# Patient Record
Sex: Female | Born: 1960
Health system: Southern US, Community
[De-identification: ages and names within clinical notes are randomized; demographics above are authoritative.]

## PROBLEM LIST (undated history)

## (undated) DIAGNOSIS — J45909 Unspecified asthma, uncomplicated: Secondary | ICD-10-CM

## (undated) HISTORY — PX: CHOLECYSTECTOMY: SHX55

## (undated) HISTORY — DX: Unspecified asthma, uncomplicated: J45.909

## (undated) HISTORY — PX: ABDOMINAL HYSTERECTOMY: SHX81

---

## 1998-06-22 ENCOUNTER — Ambulatory Visit (HOSPITAL_COMMUNITY): Admission: RE | Admit: 1998-06-22 | Discharge: 1998-06-22 | Payer: Self-pay | Admitting: Otolaryngology

## 2006-02-21 ENCOUNTER — Emergency Department (HOSPITAL_COMMUNITY): Admission: EM | Admit: 2006-02-21 | Discharge: 2006-02-21 | Payer: Self-pay | Admitting: Emergency Medicine

## 2011-10-24 ENCOUNTER — Emergency Department (HOSPITAL_COMMUNITY): Payer: Self-pay

## 2011-10-24 ENCOUNTER — Encounter: Payer: Self-pay | Admitting: Emergency Medicine

## 2011-10-24 ENCOUNTER — Emergency Department (HOSPITAL_COMMUNITY)
Admission: EM | Admit: 2011-10-24 | Discharge: 2011-10-24 | Disposition: A | Discharge status: Home / Self Care | Payer: Self-pay | Attending: Emergency Medicine | Admitting: Emergency Medicine

## 2011-10-24 DIAGNOSIS — W19XXXA Unspecified fall, initial encounter: Secondary | ICD-10-CM

## 2011-10-24 DIAGNOSIS — W010XXA Fall on same level from slipping, tripping and stumbling without subsequent striking against object, initial encounter: Secondary | ICD-10-CM | POA: Insufficient documentation

## 2011-10-24 DIAGNOSIS — M25539 Pain in unspecified wrist: Secondary | ICD-10-CM | POA: Insufficient documentation

## 2011-10-24 DIAGNOSIS — M545 Low back pain, unspecified: Secondary | ICD-10-CM | POA: Insufficient documentation

## 2011-10-24 DIAGNOSIS — Y9229 Other specified public building as the place of occurrence of the external cause: Secondary | ICD-10-CM | POA: Insufficient documentation

## 2011-10-24 DIAGNOSIS — M25579 Pain in unspecified ankle and joints of unspecified foot: Secondary | ICD-10-CM | POA: Insufficient documentation

## 2011-10-24 DIAGNOSIS — S060X9A Concussion with loss of consciousness of unspecified duration, initial encounter: Secondary | ICD-10-CM | POA: Insufficient documentation

## 2011-10-24 DIAGNOSIS — R51 Headache: Secondary | ICD-10-CM | POA: Insufficient documentation

## 2011-10-24 DIAGNOSIS — M549 Dorsalgia, unspecified: Secondary | ICD-10-CM | POA: Insufficient documentation

## 2011-10-24 DIAGNOSIS — S0990XA Unspecified injury of head, initial encounter: Secondary | ICD-10-CM

## 2011-10-24 DIAGNOSIS — S63509A Unspecified sprain of unspecified wrist, initial encounter: Secondary | ICD-10-CM | POA: Insufficient documentation

## 2011-10-24 DIAGNOSIS — M542 Cervicalgia: Secondary | ICD-10-CM | POA: Insufficient documentation

## 2011-10-24 DIAGNOSIS — R404 Transient alteration of awareness: Secondary | ICD-10-CM | POA: Insufficient documentation

## 2011-10-24 DIAGNOSIS — M25529 Pain in unspecified elbow: Secondary | ICD-10-CM | POA: Insufficient documentation

## 2011-10-24 DIAGNOSIS — S93409A Sprain of unspecified ligament of unspecified ankle, initial encounter: Secondary | ICD-10-CM | POA: Insufficient documentation

## 2011-10-24 MED ORDER — ONDANSETRON 8 MG PO TBDP
8.0000 mg | ORAL_TABLET | Freq: Once | ORAL | Status: AC
  Administered 2011-10-24: 8 mg via ORAL
  Filled 2011-10-24: qty 1

## 2011-10-24 MED ORDER — PROMETHAZINE HCL 25 MG PO TABS: 25.0000 mg | ORAL_TABLET | Freq: Four times a day (QID) | ORAL | Status: AC | PRN

## 2011-10-24 MED ORDER — HYDROCODONE-ACETAMINOPHEN 5-325 MG PO TABS: 1.0000 | ORAL_TABLET | ORAL | Status: AC | PRN

## 2011-10-24 MED ORDER — HYDROCODONE-ACETAMINOPHEN 5-325 MG PO TABS
1.0000 | ORAL_TABLET | Freq: Once | ORAL | Status: AC
  Administered 2011-10-24: 1 via ORAL
  Filled 2011-10-24: qty 1

## 2011-10-24 MED ORDER — IBUPROFEN 800 MG PO TABS
800.0000 mg | ORAL_TABLET | Freq: Once | ORAL | Status: AC
  Administered 2011-10-24: 800 mg via ORAL
  Filled 2011-10-24: qty 1

## 2011-10-24 MED ORDER — IBUPROFEN 400 MG PO TABS: 400.0000 mg | ORAL_TABLET | Freq: Three times a day (TID) | ORAL | Status: AC | PRN

## 2011-10-24 MED ORDER — OXYCODONE-ACETAMINOPHEN 5-325 MG PO TABS
1.0000 | ORAL_TABLET | Freq: Once | ORAL | Status: AC
  Administered 2011-10-24: 1 via ORAL
  Filled 2011-10-24: qty 1

## 2011-10-24 NOTE — ED Notes (Signed)
Pt states fell inside store. Pt states "came to" and needed help to get up, pt states L side pain from head to toe, and swelling in L hand.

## 2011-10-24 NOTE — ED Provider Notes (Signed)
History     CSN: [id number] Arrival date & time: 10/24/2011 12:42 PM   First MD Initiated Contact with Patient 10/24/11 1520      Chief Complaint  Patient presents with  . Fall     Patient is a 50 y.o. female presenting with fall. The history is provided by the patient.  Fall The accident occurred 1 to 2 hours ago. The fall occurred while walking. She fell from a height of 3 to 5 ft. She landed on a hard floor. The pain is present in the head, left elbow and left wrist. The pain is at a severity of 9/10. The pain is severe. She was ambulatory at the scene. There was no entrapment after the fall. There was no alcohol use involved in the accident. Associated symptoms include loss of consciousness. Pertinent negatives include no visual change, no numbness and no bowel incontinence. The symptoms are aggravated by activity.  Reports she was walking into [employer] when she slipped and both feet went out from under her and she fell. Unsure what hit first but now w/ c/o pain to head, neck, T-spine, L-spine, (L) wrist, (L) elbow and (L) ankle and states she is starting to get sore all over  History reviewed. No pertinent past medical history.  Past Surgical History  Procedure Date  . Cholecystectomy   . Abdominal hysterectomy     History reviewed. No pertinent family history.  History  Substance Use Topics  . Smoking status: Never Smoker   . Smokeless tobacco: Not on file  . Alcohol Use: No    OB History    Grav Para Term Preterm Abortions TAB SAB Ect Mult Living                  Review of Systems  Constitutional: Negative.   HENT: Negative.   Eyes: Negative.   Respiratory: Negative.   Cardiovascular: Negative.   Gastrointestinal: Negative.  Negative for bowel incontinence.  Genitourinary: Negative.   Musculoskeletal: Negative.   Skin: Negative.   Neurological: Positive for loss of consciousness. Negative for numbness.  Hematological: Negative.   Psychiatric/Behavioral:  Negative.     Allergies  Review of patient's allergies indicates no known allergies.  Home Medications   Current Outpatient Rx  Name Route Sig Dispense Refill  . MULTI-VITAMIN/MINERALS PO TABS Oral Take 1 tablet by mouth daily.        BP 131/61  Pulse 92  Temp(Src) 99.8 F (37.7 C) (Oral)  Resp 16  SpO2 97%  Physical Exam  Constitutional: She is oriented to person, place, and time. She appears well-developed and well-nourished.  HENT:  Head: Normocephalic and atraumatic.    Eyes: Conjunctivae are normal.  Neck: Neck supple.  Cardiovascular: Normal rate and regular rhythm.   Pulmonary/Chest: Effort normal and breath sounds normal.  Abdominal: Soft. Bowel sounds are normal.  Musculoskeletal: Normal range of motion.       Back:       Arms:      Feet:  Neurological: She is alert and oriented to person, place, and time.  Skin: Skin is warm and dry. Rash noted. Rash is papular. No erythema.  Psychiatric: She has a normal mood and affect.    ED Course  Procedures  Findings and impression discussed w/ pt. Cam walker placed on (L) foot and velcro splint to (L) wrist. Pt fitted and instructed for crutches. Will plan for d/c home w/ medication for pain and ortho follow up instructions.   Labs Reviewed -  No data to display Dg Thoracic Spine 2 View  10/24/2011  *RADIOLOGY REPORT*  Clinical Data: Fall, back pain  THORACIC SPINE - 2 VIEW  Comparison: None.  Findings: Normal alignment of the thoracic vertebral bodies.  No loss vertebral body height or disc height.  Normal paraspinal lines.  IMPRESSION: No evidence of thoracic spine fracture.  Original Report Authenticated By: Genevive Bi, M.D.   Dg Lumbar Spine Complete  10/24/2011  *RADIOLOGY REPORT*  Clinical Data:  Fall, back  LUMBAR SPINE - COMPLETE 4+ VIEW  Comparison: Pain  Findings: Normal alignment of the lumbar vertebral body.  No loss of vertebral body height or disc height.  No subluxation.  No pars fracture.   IMPRESSION: No evidence of lumbar spine injury.  Original Report Authenticated By: Genevive Bi, M.D.   Dg Elbow Complete Left  10/24/2011  *RADIOLOGY REPORT*  Clinical Data: Larey Seat, pain.  LEFT ELBOW - COMPLETE 3+ VIEW  Comparison:  None.  Findings:  There is no evidence of fracture, dislocation, or joint effusion.  There is no evidence of arthropathy or other focal bone abnormality.  Soft tissues are unremarkable.  IMPRESSION: Negative.  Original Report Authenticated By: Elsie Stain, M.D.   Dg Wrist Complete Left  10/24/2011  *RADIOLOGY REPORT*  Clinical Data: Fall, pain  LEFT WRIST - COMPLETE 3+ VIEW  Comparison:  None.  Findings:  There is no evidence of fracture or dislocation.  There is no evidence of arthropathy or other focal bone abnormality. Soft tissues are unremarkable.  IMPRESSION: Negative.  Original Report Authenticated By: Elsie Stain, M.D.   Dg Ankle Complete Left  10/24/2011  *RADIOLOGY REPORT*  Clinical Data: Larey Seat, pain  LEFT ANKLE COMPLETE - 3+ VIEW  Comparison: 02/21/2006  Findings: Lateral soft tissue swelling.  Question small avulsion lateral malleolus (arrow).  Healed fracture lateral fifth metatarsal.  No distal fibular or medial malleolar fracture.  Ankle mortise intact.  IMPRESSION: Lateral soft tissue swelling.  Cannot exclude small avulsion distal aspect of the lateral malleolus.  Original Report Authenticated By: Elsie Stain, M.D.   Ct Head Wo Contrast  10/24/2011  *RADIOLOGY REPORT*  Clinical Data:  Larey Seat, head pain.  Neck pain.  CT HEAD WITHOUT CONTRAST CT CERVICAL SPINE WITHOUT CONTRAST  Technique:  Multidetector CT imaging of the head and cervical spine was performed following the standard protocol without intravenous contrast.  Multiplanar CT image reconstructions of the cervical spine were also generated.  Comparison:  None.  CT HEAD  Findings: There is no evidence for acute infarction, intracranial hemorrhage, mass lesion, hydrocephalus, or extra-axial  fluid. There is no atrophy or white matter disease. 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  The calvarium is intact.  There is no acute sinus or mastoid fluid.  IMPRESSION: Chronic changes as described.  No evidence for acute intracranial hemorrhage or skull fracture.  CT CERVICAL SPINE  Findings: There is no visible cervical spine fracture or traumatic subluxation. There is no prevertebral soft tissue swelling or intraspinal abnormality.  There is disc space narrowing most pronounced at C5-C6 with osteophytic spurring, worse on the left. 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 Lung apices appear clear without pneumothorax.  Upper thoracic ribs unremarkable.  Trachea midline.  No visible neck masses.  IMPRESSION: Cervical spondylosis.  No acute abnormality.  Original Report Authenticated  By: Elsie Stain, M.D.   Ct Cervical Spine Wo Contrast  10/24/2011  *RADIOLOGY REPORT*  Clinical Data:  Larey Seat, head pain.  Neck pain.  CT HEAD WITHOUT CONTRAST CT CERVICAL SPINE WITHOUT CONTRAST  Technique:  Multidetector CT imaging of the head and cervical spine was performed following the standard protocol without intravenous contrast.  Multiplanar CT image reconstructions of the cervical spine were also generated.  Comparison:  None.  CT HEAD  Findings: There is no evidence for acute infarction, intracranial hemorrhage, mass lesion, hydrocephalus, or extra-axial fluid. There is no atrophy or white matter disease. 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  The calvarium is intact.  There is no acute sinus or mastoid fluid.  IMPRESSION: Chronic changes as described.  No evidence for acute intracranial hemorrhage or skull fracture.  CT CERVICAL  SPINE  Findings: There is no visible cervical spine fracture or traumatic subluxation. There is no prevertebral soft tissue swelling or intraspinal abnormality.  There is disc space narrowing most pronounced at C5-C6 with osteophytic spurring, worse on the left. 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 Lung apices appear clear without pneumothorax.  Upper thoracic ribs unremarkable.  Trachea midline.  No visible neck masses.  IMPRESSION: Cervical spondylosis.  No acute abnormality.  Original Report Authenticated By: Elsie Stain, M.D.   Dg Hand Complete Left  10/24/2011  *RADIOLOGY REPORT*  Clinical Data: Larey Seat, hand pain  LEFT HAND - COMPLETE 3+ VIEW  Comparison:  None.  Findings:  There is no evidence of fracture or dislocation.  There is no evidence of arthropathy or other focal bone abnormality. Soft tissues are unremarkable.  IMPRESSION: Negative.  Original Report Authenticated By: Elsie Stain, M.D.     1. Fall   2. Wrist sprain   3. Ankle sprain   4. Minor head injury   5. Concussion       MDM  Multiple areas of pain s/p fall. Imaging unable to r/o small avulsion fracture to distal lateral malleolus.  Medical screening examination/treatment/procedure(s) were performed by non-physician practitioner and as supervising physician I was immediately available for consultation/collaboration. Osvaldo Human, M.D.       Leanne Chang, NP 10/24/11 1900  Carleene Cooper III, MD 10/25/11 1149

## 2011-12-06 ENCOUNTER — Ambulatory Visit: Payer: Self-pay | Attending: Orthopedic Surgery | Admitting: Physical Therapy

## 2011-12-06 DIAGNOSIS — IMO0001 Reserved for inherently not codable concepts without codable children: Secondary | ICD-10-CM | POA: Insufficient documentation

## 2011-12-06 DIAGNOSIS — M25579 Pain in unspecified ankle and joints of unspecified foot: Secondary | ICD-10-CM | POA: Insufficient documentation

## 2011-12-06 DIAGNOSIS — M25673 Stiffness of unspecified ankle, not elsewhere classified: Secondary | ICD-10-CM | POA: Insufficient documentation

## 2011-12-06 DIAGNOSIS — M25676 Stiffness of unspecified foot, not elsewhere classified: Secondary | ICD-10-CM | POA: Insufficient documentation

## 2011-12-07 ENCOUNTER — Ambulatory Visit: Payer: Self-pay | Admitting: Physical Therapy

## 2011-12-09 ENCOUNTER — Ambulatory Visit: Payer: Self-pay | Attending: Orthopedic Surgery | Admitting: Physical Therapy

## 2011-12-09 DIAGNOSIS — M25673 Stiffness of unspecified ankle, not elsewhere classified: Secondary | ICD-10-CM | POA: Insufficient documentation

## 2011-12-09 DIAGNOSIS — M25676 Stiffness of unspecified foot, not elsewhere classified: Secondary | ICD-10-CM | POA: Insufficient documentation

## 2011-12-09 DIAGNOSIS — IMO0001 Reserved for inherently not codable concepts without codable children: Secondary | ICD-10-CM | POA: Insufficient documentation

## 2011-12-09 DIAGNOSIS — M25579 Pain in unspecified ankle and joints of unspecified foot: Secondary | ICD-10-CM | POA: Insufficient documentation

## 2011-12-12 ENCOUNTER — Ambulatory Visit: Payer: Self-pay | Admitting: Physical Therapy

## 2011-12-14 ENCOUNTER — Ambulatory Visit: Payer: Self-pay | Admitting: Physical Therapy

## 2011-12-16 ENCOUNTER — Ambulatory Visit: Payer: Self-pay | Admitting: Physical Therapy

## 2011-12-19 ENCOUNTER — Ambulatory Visit: Payer: Self-pay | Admitting: Physical Therapy

## 2011-12-21 ENCOUNTER — Ambulatory Visit: Payer: Self-pay | Admitting: Physical Therapy

## 2011-12-23 ENCOUNTER — Ambulatory Visit: Payer: Self-pay | Admitting: Physical Therapy

## 2011-12-26 ENCOUNTER — Ambulatory Visit: Payer: Self-pay | Admitting: Physical Therapy

## 2011-12-28 ENCOUNTER — Ambulatory Visit: Payer: Self-pay | Admitting: Physical Therapy

## 2011-12-30 ENCOUNTER — Ambulatory Visit: Payer: Self-pay | Admitting: Physical Therapy

## 2012-01-02 ENCOUNTER — Ambulatory Visit: Payer: Self-pay | Admitting: Physical Therapy

## 2012-01-04 ENCOUNTER — Ambulatory Visit: Payer: Self-pay | Admitting: Physical Therapy

## 2012-01-06 ENCOUNTER — Ambulatory Visit: Payer: Self-pay | Attending: Orthopedic Surgery | Admitting: Physical Therapy

## 2012-01-06 DIAGNOSIS — IMO0001 Reserved for inherently not codable concepts without codable children: Secondary | ICD-10-CM | POA: Insufficient documentation

## 2012-01-06 DIAGNOSIS — M25579 Pain in unspecified ankle and joints of unspecified foot: Secondary | ICD-10-CM | POA: Insufficient documentation

## 2012-01-06 DIAGNOSIS — M25673 Stiffness of unspecified ankle, not elsewhere classified: Secondary | ICD-10-CM | POA: Insufficient documentation

## 2012-01-06 DIAGNOSIS — M25676 Stiffness of unspecified foot, not elsewhere classified: Secondary | ICD-10-CM | POA: Insufficient documentation

## 2012-01-09 ENCOUNTER — Ambulatory Visit: Payer: Self-pay | Admitting: Physical Therapy

## 2012-01-11 ENCOUNTER — Ambulatory Visit: Payer: Self-pay | Admitting: Physical Therapy

## 2012-01-13 ENCOUNTER — Ambulatory Visit: Payer: Self-pay | Admitting: Physical Therapy

## 2012-01-16 ENCOUNTER — Ambulatory Visit: Payer: Self-pay | Admitting: Physical Therapy

## 2012-01-18 ENCOUNTER — Ambulatory Visit: Payer: Self-pay | Admitting: Physical Therapy

## 2012-01-20 ENCOUNTER — Ambulatory Visit: Payer: Self-pay | Admitting: Physical Therapy

## 2012-01-23 ENCOUNTER — Ambulatory Visit: Payer: Self-pay | Admitting: Physical Therapy

## 2012-01-25 ENCOUNTER — Ambulatory Visit: Payer: Self-pay | Admitting: Physical Therapy

## 2012-01-27 ENCOUNTER — Ambulatory Visit: Payer: Self-pay | Admitting: Physical Therapy

## 2012-01-30 ENCOUNTER — Ambulatory Visit: Payer: Self-pay | Admitting: Physical Therapy

## 2012-02-01 ENCOUNTER — Ambulatory Visit: Payer: Self-pay | Admitting: Physical Therapy

## 2012-02-03 ENCOUNTER — Ambulatory Visit: Payer: Self-pay | Admitting: Physical Therapy

## 2012-02-06 ENCOUNTER — Ambulatory Visit: Payer: Self-pay | Attending: Orthopedic Surgery | Admitting: Physical Therapy

## 2012-02-06 DIAGNOSIS — M25676 Stiffness of unspecified foot, not elsewhere classified: Secondary | ICD-10-CM | POA: Insufficient documentation

## 2012-02-06 DIAGNOSIS — M25673 Stiffness of unspecified ankle, not elsewhere classified: Secondary | ICD-10-CM | POA: Insufficient documentation

## 2012-02-06 DIAGNOSIS — M25579 Pain in unspecified ankle and joints of unspecified foot: Secondary | ICD-10-CM | POA: Insufficient documentation

## 2012-02-06 DIAGNOSIS — IMO0001 Reserved for inherently not codable concepts without codable children: Secondary | ICD-10-CM | POA: Insufficient documentation

## 2012-02-08 ENCOUNTER — Ambulatory Visit: Payer: Self-pay | Admitting: Physical Therapy

## 2012-02-10 ENCOUNTER — Ambulatory Visit: Payer: Self-pay | Admitting: Physical Therapy

## 2012-02-13 ENCOUNTER — Ambulatory Visit: Payer: Self-pay | Admitting: Physical Therapy

## 2012-02-15 ENCOUNTER — Ambulatory Visit: Payer: Self-pay | Admitting: Physical Therapy

## 2012-02-17 ENCOUNTER — Ambulatory Visit: Payer: Self-pay | Admitting: Physical Therapy

## 2012-02-20 ENCOUNTER — Ambulatory Visit: Payer: Self-pay | Admitting: Physical Therapy

## 2012-02-22 ENCOUNTER — Ambulatory Visit: Payer: Self-pay | Admitting: Physical Therapy

## 2012-02-24 ENCOUNTER — Ambulatory Visit: Payer: Self-pay | Admitting: Physical Therapy

## 2012-02-27 ENCOUNTER — Ambulatory Visit: Payer: Self-pay | Admitting: Physical Therapy

## 2012-02-29 ENCOUNTER — Ambulatory Visit: Payer: Self-pay | Admitting: Physical Therapy

## 2012-03-02 ENCOUNTER — Ambulatory Visit: Payer: Self-pay | Admitting: Physical Therapy

## 2012-03-05 ENCOUNTER — Ambulatory Visit: Payer: Self-pay | Admitting: Physical Therapy

## 2012-03-09 ENCOUNTER — Ambulatory Visit: Payer: Self-pay | Attending: Orthopedic Surgery | Admitting: Physical Therapy

## 2012-03-09 DIAGNOSIS — M25673 Stiffness of unspecified ankle, not elsewhere classified: Secondary | ICD-10-CM | POA: Insufficient documentation

## 2012-03-09 DIAGNOSIS — IMO0001 Reserved for inherently not codable concepts without codable children: Secondary | ICD-10-CM | POA: Insufficient documentation

## 2012-03-09 DIAGNOSIS — M25676 Stiffness of unspecified foot, not elsewhere classified: Secondary | ICD-10-CM | POA: Insufficient documentation

## 2012-03-09 DIAGNOSIS — M25579 Pain in unspecified ankle and joints of unspecified foot: Secondary | ICD-10-CM | POA: Insufficient documentation

## 2012-03-12 ENCOUNTER — Ambulatory Visit: Payer: Self-pay | Admitting: Physical Therapy

## 2012-03-14 ENCOUNTER — Ambulatory Visit: Payer: Self-pay | Admitting: Physical Therapy

## 2012-03-16 ENCOUNTER — Ambulatory Visit: Payer: Self-pay | Admitting: Physical Therapy

## 2014-12-02 ENCOUNTER — Ambulatory Visit: Payer: Self-pay | Attending: Internal Medicine | Admitting: Internal Medicine

## 2014-12-02 ENCOUNTER — Encounter: Payer: Self-pay | Admitting: Internal Medicine

## 2014-12-02 VITALS — BP 131/84 | HR 80 | Temp 98.0°F | Resp 15 | Wt 286.0 lb

## 2014-12-02 DIAGNOSIS — R1310 Dysphagia, unspecified: Secondary | ICD-10-CM | POA: Insufficient documentation

## 2014-12-02 DIAGNOSIS — Z1231 Encounter for screening mammogram for malignant neoplasm of breast: Secondary | ICD-10-CM

## 2014-12-02 DIAGNOSIS — H6121 Impacted cerumen, right ear: Secondary | ICD-10-CM | POA: Insufficient documentation

## 2014-12-02 DIAGNOSIS — Z1211 Encounter for screening for malignant neoplasm of colon: Secondary | ICD-10-CM | POA: Insufficient documentation

## 2014-12-02 DIAGNOSIS — Z139 Encounter for screening, unspecified: Secondary | ICD-10-CM

## 2014-12-02 DIAGNOSIS — Z90710 Acquired absence of both cervix and uterus: Secondary | ICD-10-CM | POA: Insufficient documentation

## 2014-12-02 LAB — CBC WITH DIFFERENTIAL/PLATELET
BASOS ABS: 0.1 10*3/uL (ref 0.0–0.1)
BASOS PCT: 1 % (ref 0–1)
Eosinophils Absolute: 0.3 10*3/uL (ref 0.0–0.7)
Eosinophils Relative: 3 % (ref 0–5)
HCT: 47.3 % — ABNORMAL HIGH (ref 36.0–46.0)
HEMOGLOBIN: 15.3 g/dL — AB (ref 12.0–15.0)
LYMPHS ABS: 2.9 10*3/uL (ref 0.7–4.0)
Lymphocytes Relative: 29 % (ref 12–46)
MCH: 29 pg (ref 26.0–34.0)
MCHC: 32.3 g/dL (ref 30.0–36.0)
MCV: 89.6 fL (ref 78.0–100.0)
MONOS PCT: 9 % (ref 3–12)
MPV: 12.1 fL (ref 8.6–12.4)
Monocytes Absolute: 0.9 10*3/uL (ref 0.1–1.0)
NEUTROS ABS: 5.7 10*3/uL (ref 1.7–7.7)
NEUTROS PCT: 58 % (ref 43–77)
Platelets: 248 10*3/uL (ref 150–400)
RBC: 5.28 MIL/uL — AB (ref 3.87–5.11)
RDW: 13.6 % (ref 11.5–15.5)
WBC: 9.9 10*3/uL (ref 4.0–10.5)

## 2014-12-02 LAB — HEMOGLOBIN A1C
HEMOGLOBIN A1C: 5.9 % — AB (ref <5.7)
Mean Plasma Glucose: 123 mg/dL — ABNORMAL HIGH (ref <117)

## 2014-12-02 LAB — COMPLETE METABOLIC PANEL WITH GFR
ALT: 30 U/L (ref 0–35)
AST: 27 U/L (ref 0–37)
Albumin: 4.4 g/dL (ref 3.5–5.2)
Alkaline Phosphatase: 55 U/L (ref 39–117)
BUN: 10 mg/dL (ref 6–23)
CALCIUM: 10.2 mg/dL (ref 8.4–10.5)
CHLORIDE: 102 meq/L (ref 96–112)
CO2: 29 mEq/L (ref 19–32)
CREATININE: 0.52 mg/dL (ref 0.50–1.10)
GFR, Est African American: >89 mL/min
GFR, Est Non African American: >89 mL/min
Glucose, Bld: 88 mg/dL (ref 70–99)
Potassium: 4.5 mEq/L (ref 3.5–5.3)
Sodium: 142 mEq/L (ref 135–145)
TOTAL PROTEIN: 7.5 g/dL (ref 6.0–8.3)
Total Bilirubin: 0.6 mg/dL (ref 0.2–1.2)

## 2014-12-02 LAB — TSH: TSH: 3.113 u[IU]/mL (ref 0.350–4.500)

## 2014-12-02 MED ORDER — CARBAMIDE PEROXIDE 6.5 % OT SOLN: 5.0000 [drp] | Freq: Two times a day (BID) | OTIC | Status: DC

## 2014-12-02 NOTE — Progress Notes (Signed)
Patient Demographics  Jasmine Martin, is a 54 y.o. female  UDJ:[id number]  HYI:[id number]  DOB - 14-Aug-1961  CC:  Chief Complaint  Patient presents with  . Establish Care       HPI: Jasmine Martin is a 54 y.o. female here today to establish medical care.patient reported to have difficulty swallowing for the last 2 years, the patient reported that problem is usually with a solid food and gradually progressed, patient denies much of reflux symptoms, denies any change in bowel habits, denies any change in weight or appetite, denies any abdominal pain nausea vomiting. Patient has No headache, No chest pain, No abdominal pain - No Nausea, No new weakness tingling or numbness, No Cough - SOB.  No Known Allergies History reviewed. No pertinent past medical history. Current Outpatient Prescriptions on File Prior to Visit  Medication Sig Dispense Refill  . Multiple Vitamins-Minerals (MULTIVITAMIN WITH MINERALS) tablet Take 1 tablet by mouth daily.       No current facility-administered medications on file prior to visit.   Family History  Problem Relation Age of Onset  . Cancer Father     liver cancer   . Hypertension Maternal Aunt    History   Social History  . Marital Status: Single    Spouse Name: N/A    Number of Children: N/A  . Years of Education: N/A   Occupational History  . Not on file.   Social History Main Topics  . Smoking status: Never Smoker   . Smokeless tobacco: Not on file  . Alcohol Use: No  . Drug Use: No  . Sexual Activity: Not on file   Other Topics Concern  . Not on file   Social History Narrative    Review of Systems: Constitutional: Negative for fever, chills, diaphoresis, activity change, appetite change and fatigue. HENT: Negative for ear pain, nosebleeds, congestion, facial swelling, rhinorrhea, neck pain, neck stiffness and ear discharge.  Eyes: Negative for pain, discharge, redness, itching and visual disturbance. Respiratory:  Negative for cough, choking, chest tightness, shortness of breath, wheezing and stridor.  Cardiovascular: Negative for chest pain, palpitations and leg swelling. Gastrointestinal: Negative for abdominal distention. Genitourinary: Negative for dysuria, urgency, frequency, hematuria, flank pain, decreased urine volume, difficulty urinating and dyspareunia.  Musculoskeletal: Negative for back pain, joint swelling, arthralgia and gait problem. Neurological: Negative for dizziness, tremors, seizures, syncope, facial asymmetry, speech difficulty, weakness, light-headedness, numbness and headaches.  Hematological: Negative for adenopathy. Does not bruise/bleed easily. Psychiatric/Behavioral: Negative for hallucinations, behavioral problems, confusion, dysphoric mood, decreased concentration and agitation.    Objective:   Filed Vitals:   12/02/14 1508  BP: 131/84  Pulse: 80  Temp: 98 F (36.7 C)  Resp: 15    Physical Exam: Constitutional: obese female not in acute distress  HENT: Normocephalic, atraumatic, External right and left ear normal. Oropharynx is clear and moist. Increased wax in right ear  Eyes: Conjunctivae and EOM are normal. PERRLA, no scleral icterus. Neck: Normal ROM. Neck supple. No JVD. No tracheal deviation. No thyromegaly. CVS: RRR, S1/S2 +, no murmurs, no gallops, no carotid bruit.  Pulmonary: Effort and breath sounds normal, no stridor, rhonchi, wheezes, rales.  Abdominal: Soft. BS +, no distension, tenderness, rebound or guarding.  Musculoskeletal: Normal range of motion. No edema and no tenderness.  Neuro: Alert. Normal reflexes, muscle tone coordination. No cranial nerve deficit. Skin: Skin is warm and dry. No rash noted. Not diaphoretic. No erythema. No pallor. Psychiatric: Normal mood and affect. Behavior, judgment,  thought content normal.  No results found for: WBC, HGB, HCT, MCV, PLT No results found for: CREATININE, BUN, NA, K, CL, CO2  No results found for:  HGBA1C Lipid Panel  No results found for: CHOL, TRIG, HDL, CHOLHDL, VLDL, LDLCALC     Assessment and plan:   1. Swallowing difficulty  - Ambulatory referral to Gastroenterology  2. Special screening for malignant neoplasms, colon  - Ambulatory referral to Gastroenterology  3. Encounter for screening mammogram for breast cancer  - MM DIGITAL SCREENING BILATERAL; Future  4. Screening Her ordered baseline blood work  - CBC with Differential/Platelet - COMPLETE METABOLIC PANEL WITH GFR - TSH - Vit D  25 hydroxy (rtn osteoporosis monitoring) - Hemoglobin A1c  5. Excess ear wax, right  - carbamide peroxide (DEBROX) 6.5 % otic solution; Place 5 drops into both ears 2 (two) times daily.  Dispense: 15 mL; Refill: 1      Health Maintenance -Colonoscopy: referred to GI  -Pap Smear: patient is s/p hystrectomy  -Mammogram: ordered  -Vaccinations:  uptodate with the flu shot   Return in about 3 months (around 03/03/2015), or if symptoms worsen or fail to improve.  Jasmine Marek, MD

## 2014-12-02 NOTE — Progress Notes (Signed)
Patient here to establish care Complains of having trouble swallowing  This issue has been going on for about two years Purees her food to eat  Has not been to a specialist to have this addressed

## 2014-12-03 LAB — VITAMIN D 25 HYDROXY (VIT D DEFICIENCY, FRACTURES): VIT D 25 HYDROXY: 19 ng/mL — AB (ref 30–100)

## 2014-12-10 ENCOUNTER — Encounter: Payer: Self-pay | Admitting: Gastroenterology

## 2014-12-10 ENCOUNTER — Ambulatory Visit (INDEPENDENT_AMBULATORY_CARE_PROVIDER_SITE_OTHER): Payer: Self-pay | Admitting: Gastroenterology

## 2014-12-10 VITALS — BP 122/74 | HR 76 | Ht 63.5 in | Wt 284.2 lb

## 2014-12-10 DIAGNOSIS — Z1211 Encounter for screening for malignant neoplasm of colon: Secondary | ICD-10-CM

## 2014-12-10 DIAGNOSIS — R1310 Dysphagia, unspecified: Secondary | ICD-10-CM

## 2014-12-10 MED ORDER — NA SULFATE-K SULFATE-MG SULF 17.5-3.13-1.6 GM/177ML PO SOLN: 1.0000 | Freq: Once | ORAL | Status: DC

## 2014-12-10 NOTE — Progress Notes (Signed)
    _                                                                                                                History of Present Illness:  Jasmine Martin is a 54 year old white female referred for evaluation of dysphagia.  Over the past 2 years she's had progressive dysphagia to solids and, occasionally, liquids.  She has had 2 vomit up food that has impacted in her esophagus.  She denies pyrosis or weight loss.  Patient has never had a colonoscopy.  She has no lower GI complaints.   Past Medical History  Diagnosis Date  . Asthma    Past Surgical History  Procedure Laterality Date  . Cholecystectomy    . Abdominal hysterectomy     family history includes Colon polyps in her mother; Diabetes in her maternal aunt and maternal grandfather; Esophageal cancer in her paternal grandfather; Hypertension in her maternal aunt; Liver cancer in her father. There is no history of Colon cancer or Kidney disease. Current Outpatient Prescriptions  Medication Sig Dispense Refill  . carbamide peroxide (DEBROX) 6.5 % otic solution Place 5 drops into both ears 2 (two) times daily. 15 mL 1  . Multiple Vitamins-Minerals (MULTIVITAMIN WITH MINERALS) tablet Take 1 tablet by mouth daily.       No current facility-administered medications for this visit.   Allergies as of 12/10/2014 - Review Complete 12/10/2014  Allergen Reaction Noted  . Latex Itching and Rash 12/10/2014    reports that she has never smoked. She has never used smokeless tobacco. She reports that she does not drink alcohol or use illicit drugs.   Review of Systems: She complains of chronic low back pain Pertinent positive and negative review of systems were noted in the above HPI section. All other review of systems were otherwise negative.  Vital signs were reviewed in today's medical record Physical Exam: General: Obese female in no acute distress Skin: anicteric Head: Normocephalic and atraumatic Eyes:  sclerae  anicteric, EOMI Ears: Normal auditory acuity Mouth: No deformity or lesions Neck: Supple, no masses or thyromegaly Lungs: Clear throughout to auscultation Heart: Regular rate and rhythm; no murmurs, rubs or bruits Abdomen: Soft, non tender and non distended. No masses, hepatosplenomegaly or hernias noted. Normal Bowel sounds Rectal:deferred Musculoskeletal: Symmetrical with no gross deformities  Skin: No lesions on visible extremities Pulses:  Normal pulses noted Extremities: No clubbing, cyanosis, edema or deformities noted Neurological: Alert oriented x 4, grossly nonfocal Cervical Nodes:  No significant cervical adenopathy Inguinal Nodes: No significant inguinal adenopathy Psychological:  Alert and cooperative. Normal mood and affect  See Assessment and Plan under Problem List

## 2014-12-10 NOTE — Patient Instructions (Signed)
You have been scheduled for an endoscopy. Please follow written instructions given to you at your visit today. If you use inhalers (even only as needed), please bring them with you on the day of your procedure. Your physician has requested that you go to www.startemmi.com and enter the access code given to you at your visit today. This web site gives a general overview about your procedure. However, you should still follow specific instructions given to you by our office regarding your preparation for the procedure.  You have been scheduled for a colonoscopy. Please follow written instructions given to you at your visit today.  Please pick up your prep kit at the pharmacy within the next 1-3 days. If you use inhalers (even only as needed), please bring them with you on the day of your procedure. Your physician has requested that you go to www.startemmi.com and enter the access code given to you at your visit today. This web site gives a general overview about your procedure. However, you should still follow specific instructions given to you by our office regarding your preparation for the procedure. 

## 2014-12-10 NOTE — Assessment & Plan Note (Signed)
Year history of progressive dysphagia to solids and now liquids.  Rule out optic stricture.  Motility disorder is less likely.  Recommendations #1 upper endoscopy with dilation as indicated  Risks, alternatives, and complications of the procedure, including bleeding, perforation, and possible need for surgery, were explained to the patient.  Patient's questions were answered.

## 2014-12-10 NOTE — Assessment & Plan Note (Signed)
Plan screening colonoscopy 

## 2014-12-12 ENCOUNTER — Encounter: Payer: Self-pay | Admitting: Gastroenterology

## 2014-12-12 ENCOUNTER — Telehealth: Payer: Self-pay

## 2014-12-12 ENCOUNTER — Ambulatory Visit (AMBULATORY_SURGERY_CENTER): Payer: Self-pay | Admitting: Gastroenterology

## 2014-12-12 VITALS — BP 124/59 | HR 77 | Temp 98.7°F | Resp 20 | Ht 63.5 in | Wt 284.0 lb

## 2014-12-12 DIAGNOSIS — R1310 Dysphagia, unspecified: Secondary | ICD-10-CM

## 2014-12-12 DIAGNOSIS — K209 Esophagitis, unspecified without bleeding: Secondary | ICD-10-CM

## 2014-12-12 DIAGNOSIS — K222 Esophageal obstruction: Secondary | ICD-10-CM

## 2014-12-12 DIAGNOSIS — H6121 Impacted cerumen, right ear: Secondary | ICD-10-CM

## 2014-12-12 MED ORDER — SODIUM CHLORIDE 0.9 % IV SOLN: 500.0000 mL | INTRAVENOUS | Status: DC

## 2014-12-12 MED ORDER — VITAMIN D (ERGOCALCIFEROL) 1.25 MG (50000 UNIT) PO CAPS
50000.0000 [IU] | ORAL_CAPSULE | ORAL | Status: DC
Start: 2014-12-12 — End: 2016-01-11

## 2014-12-12 NOTE — Telephone Encounter (Signed)
Pt aware of results Rx send to [facility]

## 2014-12-12 NOTE — Progress Notes (Signed)
Report to PACU, RN, vss, BBS= Clear.  

## 2014-12-12 NOTE — Patient Instructions (Signed)
Discharge instructions given. Handout on a dilatation diet. Resume previous medications. YOU HAD AN ENDOSCOPIC PROCEDURE TODAY AT [facility]: Refer to the procedure report that was given to you for any specific questions about what was found during the examination.  If the procedure report does not answer your questions, please call your gastroenterologist to clarify.  If you requested that your care partner not be given the details of your procedure findings, then the procedure report has been included in a sealed envelope for you to review at your convenience later.  YOU SHOULD EXPECT: Some feelings of bloating in the abdomen. Passage of more gas than usual.  Walking can help get rid of the air that was put into your GI tract during the procedure and reduce the bloating. If you had a lower endoscopy (such as a colonoscopy or flexible sigmoidoscopy) you may notice spotting of blood in your stool or on the toilet paper. If you underwent a bowel prep for your procedure, then you may not have a normal bowel movement for a few days.  DIET: Your first meal following the procedure should be a light meal and then it is ok to progress to your normal diet.  A half-sandwich or bowl of soup is an example of a good first meal.  Heavy or fried foods are harder to digest and may make you feel nauseous or bloated.  Likewise meals heavy in dairy and vegetables can cause extra gas to form and this can also increase the bloating.  Drink plenty of fluids but you should avoid alcoholic beverages for 24 hours.  ACTIVITY: Your care partner should take you home directly after the procedure.  You should plan to take it easy, moving slowly for the rest of the day.  You can resume normal activity the day after the procedure however you should NOT DRIVE or use heavy machinery for 24 hours (because of the sedation medicines used during the test).    SYMPTOMS TO REPORT IMMEDIATELY: A gastroenterologist can be  reached at any hour.  During normal business hours, 8:30 AM to 5:00 PM Monday through Friday, call [phone].  After hours and on weekends, please call the GI answering service at [phone] who will take a message and have the physician on call contact you.   Following upper endoscopy (EGD)  Vomiting of blood or coffee ground material  New chest pain or pain under the shoulder blades  Painful or persistently difficult swallowing  New shortness of breath  Fever of 100F or higher  Black, tarry-looking stools  FOLLOW UP: If any biopsies were taken you will be contacted by phone or by letter within the next 1-3 weeks.  Call your gastroenterologist if you have not heard about the biopsies in 3 weeks.  Our staff will call the home number listed on your records the next business day following your procedure to check on you and address any questions or concerns that you may have at that time regarding the information given to you following your procedure. This is a courtesy call and so if there is no answer at the home number and we have not heard from you through the emergency physician on call, we will assume that you have returned to your regular daily activities without incident.  SIGNATURES/CONFIDENTIALITY: You and/or your care partner have signed paperwork which will be entered into your electronic medical record.  These signatures attest to the fact that that the information above on your After  Visit Summary has been reviewed and is understood.  Full responsibility of the confidentiality of this discharge information lies with you and/or your care-partner. 

## 2014-12-12 NOTE — Telephone Encounter (Signed)
-----   Message from Lorayne Marek, MD sent at 12/03/2014  9:12 AM EST ----- Blood work reviewed noticed hemoglobin A1c of 5.9%, patient has prediabetes, call and advise patient for low carbohydrate diet. , noticed low vitamin D, call patient advise to start ergocalciferol 50,000 units once a week for the duration of  12 weeks.

## 2014-12-12 NOTE — Progress Notes (Signed)
Called to room to assist during endoscopic procedure.  Patient ID and intended procedure confirmed with present staff. Received instructions for my participation in the procedure from the performing physician.  

## 2014-12-12 NOTE — Op Note (Signed)
[facility]  [address] [place], [id number]   ENDOSCOPY PROCEDURE REPORT  PATIENT: Jasmine Martin, Jasmine Martin  MR#: [id number] BIRTHDATE: 10-08-1961 , 65  yrs. old GENDER: female ENDOSCOPIST: Inda Castle, MD REFERRED BY: PROCEDURE DATE:  12/12/2014 PROCEDURE:  EGD w/ biopsy and EGD w/ balloon dilation ASA CLASS:     Class II INDICATIONS:  dysphagia. MEDICATIONS: Monitored anesthesia care and Propofol 200 mg IV TOPICAL ANESTHETIC:  DESCRIPTION OF PROCEDURE: After the risks benefits and alternatives of the procedure were thoroughly explained, informed consent was obtained.  The LB [id number] endoscope was introduced through the mouth and advanced to the second portion of the duodenum , Without limitations.  The instrument was slowly withdrawn as the mucosa was fully examined.    ESOPHAGUS: There was a benign appearing stricture in the distal esophagus.  The stricture was traversable with resistance.   There was LA Class c esophagitis (One or more mucosal breaks > 46mm, but without continuity across mucosal folds) noted.   Abnormal mucosa was found in the distal esophagus and mid esophagus. There was diffuse erythema.  There is question longitudinal furrows. Multiple biopsies were taken.   Except for the findings listed the EGD was otherwise normal. a TTS balloon dilator was passed the scope into the stricture.  The wound was plated to 12, 13.5 and 15 mm  for 30 seconds each.  There was mild to moderate resistance to the last 2 balloon dilators.  There is a small amount of heme following dilation. Retroflexed views revealed no abnormalities. The scope was then withdrawn from the patient and the procedure completed.  COMPLICATIONS: There were no immediate complications.  ENDOSCOPIC IMPRESSION: 1.  esophageal stricture?"status post balloon dilation 2.  Esophagitis?"rule out eosinophilic esophagitis  RECOMMENDATIONS: 1.  Await biopsy results 2.   begin Protonix 40 g daily  REPEAT EXAM:  eSigned:  Inda Castle, MD 12/12/2014 2:04 PM    CC: Lorayne Marek, MD  PATIENT NAME:  Jasmine Martin, Jasmine Martin MR#: [id number]

## 2014-12-15 ENCOUNTER — Telehealth: Payer: Self-pay

## 2014-12-15 MED ORDER — PANTOPRAZOLE SODIUM 40 MG PO TBEC: 40.0000 mg | DELAYED_RELEASE_TABLET | Freq: Every day | ORAL | Status: DC

## 2014-12-15 NOTE — Telephone Encounter (Signed)
Left message on answering machine. 

## 2014-12-19 ENCOUNTER — Ambulatory Visit: Payer: Self-pay

## 2014-12-19 ENCOUNTER — Encounter: Payer: Self-pay | Admitting: Gastroenterology

## 2015-01-12 ENCOUNTER — Ambulatory Visit: Payer: Self-pay

## 2015-01-15 ENCOUNTER — Encounter: Payer: Self-pay | Admitting: Gastroenterology

## 2015-01-15 ENCOUNTER — Ambulatory Visit (AMBULATORY_SURGERY_CENTER): Payer: Self-pay | Admitting: Gastroenterology

## 2015-01-15 VITALS — BP 149/78 | HR 85 | Temp 98.8°F | Resp 19 | Ht 63.0 in | Wt 284.0 lb

## 2015-01-15 DIAGNOSIS — Z1211 Encounter for screening for malignant neoplasm of colon: Secondary | ICD-10-CM

## 2015-01-15 DIAGNOSIS — D509 Iron deficiency anemia, unspecified: Secondary | ICD-10-CM

## 2015-01-15 DIAGNOSIS — D120 Benign neoplasm of cecum: Secondary | ICD-10-CM

## 2015-01-15 DIAGNOSIS — D124 Benign neoplasm of descending colon: Secondary | ICD-10-CM

## 2015-01-15 DIAGNOSIS — D123 Benign neoplasm of transverse colon: Secondary | ICD-10-CM

## 2015-01-15 MED ORDER — SODIUM CHLORIDE 0.9 % IV SOLN: 500.0000 mL | INTRAVENOUS | Status: DC

## 2015-01-15 NOTE — Op Note (Signed)
[facility]  [address] [place], [id number]   COLONOSCOPY PROCEDURE REPORT  PATIENT: Jasmine Martin, Jasmine Martin  MR#: [id number] BIRTHDATE: [birth year] , 50  yrs. old GENDER: female ENDOSCOPIST: Inda Castle, MD REFERRED BY: PROCEDURE DATE:  01/15/2015 PROCEDURE:   Colonoscopy with cold biopsy polypectomy and Colonoscopy, screening First Screening Colonoscopy - Avg.  risk and is 50 yrs.  old or older Yes.  Prior Negative Screening - Now for repeat screening. N/A  History of Adenoma - Now for follow-up colonoscopy & has been > or = to 3 yrs.  N/A ASA CLASS:   Class II INDICATIONS:Colorectal Neoplasm Risk Assessment for this procedure is average risk. MEDICATIONS: Monitored anesthesia care, Propofol 300 mg IV, and Lidocaine 40 mg IV  DESCRIPTION OF PROCEDURE:   After the risks benefits and alternatives of the procedure were thoroughly explained, informed consent was obtained.  The digital rectal exam revealed no abnormalities of the rectum.   The LB [id number]  endoscope was introduced through the anus and advanced to the cecum, which was identified by both the appendix and ileocecal valve. No adverse events experienced.   The quality of the prep was (Suprep was used) excellent.  The instrument was then slowly withdrawn as the colon was fully examined.      COLON FINDINGS: A sessile polyp measuring 2 mm in size was found at the cecum.  A polypectomy was performed with cold forceps. The remainder of the exam including rectum, sigmoid, descending colon, transverse colon, and ascending colon was normal. Retroflexed views revealed no abnormalities. The time to cecum = 6.4 Withdrawal time = 7.6   The scope was withdrawn and the procedure completed. COMPLICATIONS: There were no immediate complications.  ENDOSCOPIC IMPRESSION: Sessile polyp was found at the cecum; polypectomy was performed with cold forceps  RECOMMENDATIONS: If the polyp(s) removed today  are proven to be adenomatous (pre-cancerous) polyps, you will need a repeat colonoscopy in 5 years.  Otherwise you should continue to follow colorectal cancer screening guidelines for "routine risk" patients with colonoscopy in 10 years.  You will receive a letter within 1-2 weeks with the results of your biopsy as well as final recommendations.  Please call my office if you have not received a letter after 3 weeks.  eSigned:  Inda Castle, MD 01/15/2015 3:00 PM   cc: Gean Maidens, MD

## 2015-01-15 NOTE — Patient Instructions (Signed)
YOU HAD AN ENDOSCOPIC PROCEDURE TODAY AT [facility]:   Refer to the procedure report that was given to you for any specific questions about what was found during the examination.  If the procedure report does not answer your questions, please call your gastroenterologist to clarify.  If you requested that your care partner not be given the details of your procedure findings, then the procedure report has been included in a sealed envelope for you to review at your convenience later.  YOU SHOULD EXPECT: Some feelings of bloating in the abdomen. Passage of more gas than usual.  Walking can help get rid of the air that was put into your GI tract during the procedure and reduce the bloating. If you had a lower endoscopy (such as a colonoscopy or flexible sigmoidoscopy) you may notice spotting of blood in your stool or on the toilet paper. If you underwent a bowel prep for your procedure, you may not have a normal bowel movement for a few days.  Please Note:  You might notice some irritation and congestion in your nose or some drainage.  This is from the oxygen used during your procedure.  There is no need for concern and it should clear up in a day or so.  SYMPTOMS TO REPORT IMMEDIATELY:   Following lower endoscopy (colonoscopy or flexible sigmoidoscopy):  Excessive amounts of blood in the stool  Significant tenderness or worsening of abdominal pains  Swelling of the abdomen that is new, acute  Fever of 100F or higher    For urgent or emergent issues, a gastroenterologist can be reached at any hour by calling [phone].   DIET: Your first meal following the procedure should be a small meal and then it is ok to progress to your normal diet. Heavy or fried foods are harder to digest and may make you feel nauseous or bloated.  Likewise, meals heavy in dairy and vegetables can increase bloating.  Drink plenty of fluids but you should avoid alcoholic beverages for 24  hours.  ACTIVITY:  You should plan to take it easy for the rest of today and you should NOT DRIVE or use heavy machinery until tomorrow (because of the sedation medicines used during the test).    FOLLOW UP: Our staff will call the number listed on your records the next business day following your procedure to check on you and address any questions or concerns that you may have regarding the information given to you following your procedure. If we do not reach you, we will leave a message.  However, if you are feeling well and you are not experiencing any problems, there is no need to return our call.  We will assume that you have returned to your regular daily activities without incident.  If any biopsies were taken you will be contacted by phone or by letter within the next 1-3 weeks.  Please call us at [phone] if you have not heard about the biopsies in 3 weeks.    SIGNATURES/CONFIDENTIALITY: You and/or your care partner have signed paperwork which will be entered into your electronic medical record.  These signatures attest to the fact that that the information above on your After Visit Summary has been reviewed and is understood.  Full responsibility of the confidentiality of this discharge information lies with you and/or your care-partner.   INFORMATION ON POLYPS GIVEN TO YOU TODAY

## 2015-01-15 NOTE — Progress Notes (Signed)
Stable to RR 

## 2015-01-15 NOTE — Progress Notes (Signed)
Called to room to assist during endoscopic procedure.  Patient ID and intended procedure confirmed with present staff. Received instructions for my participation in the procedure from the performing physician.  

## 2015-01-16 ENCOUNTER — Telehealth: Payer: Self-pay | Admitting: *Deleted

## 2015-01-16 NOTE — Telephone Encounter (Signed)
  Follow up Call-  Call back number 01/15/2015 12/12/2014  Post procedure Call Back phone  # [phone]  Permission to leave phone message Yes Yes     Patient questions:  Message left to call us if necessary.

## 2015-01-20 ENCOUNTER — Ambulatory Visit (HOSPITAL_COMMUNITY)
Admission: RE | Admit: 2015-01-20 | Discharge: 2015-01-20 | Disposition: A | Discharge status: Home / Self Care | Payer: Self-pay | Source: Ambulatory Visit | Attending: Internal Medicine | Admitting: Internal Medicine

## 2015-01-20 DIAGNOSIS — Z1231 Encounter for screening mammogram for malignant neoplasm of breast: Secondary | ICD-10-CM

## 2015-01-21 ENCOUNTER — Ambulatory Visit: Payer: Self-pay | Attending: Internal Medicine

## 2015-01-22 ENCOUNTER — Encounter: Payer: Self-pay | Admitting: Gastroenterology

## 2015-01-22 ENCOUNTER — Other Ambulatory Visit: Payer: Self-pay | Admitting: Internal Medicine

## 2015-01-22 DIAGNOSIS — R928 Other abnormal and inconclusive findings on diagnostic imaging of breast: Secondary | ICD-10-CM

## 2015-01-26 ENCOUNTER — Telehealth (HOSPITAL_COMMUNITY): Payer: Self-pay | Admitting: *Deleted

## 2015-01-26 NOTE — Telephone Encounter (Signed)
Telephoned patient's home # and is has been changed or disconnected.

## 2015-01-27 ENCOUNTER — Telehealth (HOSPITAL_COMMUNITY): Payer: Self-pay | Admitting: *Deleted

## 2015-01-27 ENCOUNTER — Other Ambulatory Visit: Payer: Self-pay | Admitting: Internal Medicine

## 2015-01-27 ENCOUNTER — Other Ambulatory Visit: Payer: Self-pay | Admitting: *Deleted

## 2015-01-27 DIAGNOSIS — R928 Other abnormal and inconclusive findings on diagnostic imaging of breast: Secondary | ICD-10-CM

## 2015-01-27 NOTE — Telephone Encounter (Signed)
Telephoned patient at home # and left message to return call to [facility] 

## 2015-02-12 ENCOUNTER — Ambulatory Visit (HOSPITAL_COMMUNITY): Payer: Self-pay | Attending: Obstetrics and Gynecology

## 2015-02-16 ENCOUNTER — Ambulatory Visit: Payer: Self-pay | Admitting: Gastroenterology

## 2015-02-16 ENCOUNTER — Telehealth: Payer: Self-pay | Admitting: Gastroenterology

## 2015-02-16 ENCOUNTER — Other Ambulatory Visit: Payer: Self-pay

## 2015-02-16 NOTE — Telephone Encounter (Signed)
No Charge 

## 2015-03-02 ENCOUNTER — Other Ambulatory Visit: Payer: Self-pay

## 2015-03-02 ENCOUNTER — Ambulatory Visit
Admission: RE | Admit: 2015-03-02 | Discharge: 2015-03-02 | Disposition: A | Discharge status: Home / Self Care | Payer: No Typology Code available for payment source | Source: Ambulatory Visit | Attending: Internal Medicine | Admitting: Internal Medicine

## 2015-03-02 DIAGNOSIS — R928 Other abnormal and inconclusive findings on diagnostic imaging of breast: Secondary | ICD-10-CM

## 2015-04-17 ENCOUNTER — Ambulatory Visit: Payer: Self-pay | Admitting: Gastroenterology

## 2015-05-14 ENCOUNTER — Encounter: Payer: Self-pay | Admitting: Gastroenterology

## 2015-05-18 ENCOUNTER — Ambulatory Visit: Payer: Self-pay | Attending: Internal Medicine | Admitting: Internal Medicine

## 2015-05-18 ENCOUNTER — Encounter: Payer: Self-pay | Admitting: Internal Medicine

## 2015-05-18 ENCOUNTER — Telehealth: Payer: Self-pay | Admitting: Internal Medicine

## 2015-05-18 VITALS — BP 130/80 | HR 75 | Temp 98.0°F | Resp 16 | Wt 287.0 lb

## 2015-05-18 DIAGNOSIS — J45909 Unspecified asthma, uncomplicated: Secondary | ICD-10-CM | POA: Insufficient documentation

## 2015-05-18 DIAGNOSIS — Z79899 Other long term (current) drug therapy: Secondary | ICD-10-CM | POA: Insufficient documentation

## 2015-05-18 DIAGNOSIS — R7309 Other abnormal glucose: Secondary | ICD-10-CM | POA: Insufficient documentation

## 2015-05-18 DIAGNOSIS — E559 Vitamin D deficiency, unspecified: Secondary | ICD-10-CM

## 2015-05-18 DIAGNOSIS — R7303 Prediabetes: Secondary | ICD-10-CM

## 2015-05-18 DIAGNOSIS — R0602 Shortness of breath: Secondary | ICD-10-CM

## 2015-05-18 DIAGNOSIS — Z8709 Personal history of other diseases of the respiratory system: Secondary | ICD-10-CM

## 2015-05-18 MED ORDER — ALBUTEROL SULFATE HFA 108 (90 BASE) MCG/ACT IN AERS: 2.0000 | INHALATION_SPRAY | Freq: Four times a day (QID) | RESPIRATORY_TRACT | Status: DC | PRN

## 2015-05-18 NOTE — Progress Notes (Signed)
Patient complains of having some trouble breathing But with the hot and humid weather it has gotten worse Patient does not use an inhaler or nebulizer

## 2015-05-18 NOTE — Telephone Encounter (Signed)
error 

## 2015-05-18 NOTE — Progress Notes (Signed)
MRN: [id number] Name: Jasmine Martin  Sex: female Age: 54 y.o. DOB: 10-26-1961  Allergies: Latex  Chief Complaint  Patient presents with  . Shortness of Breath    HPI: Patient is 54 y.o. female who has history of asthma as per patient she used to be on inhalers in the past, recently has been having wheezing/shortness of breath, patient denies smoking cigarettes denies any orthopnea PND or leg swelling, patient does not have any inhalers currently, she also had colonoscopy done which reported a polyp she is due in 5 years, she also up-to-date with mammogram.previous blood work reviewed with the patient noticed hemoglobin A1c of 5.9%, patient has prediabetes, also she had elevated blood pressure initially, repeat manual blood pressure is 130/80, she denies any headache dizziness numbness weakness.  Past Medical History  Diagnosis Date  . Asthma     Past Surgical History  Procedure Laterality Date  . Cholecystectomy    . Abdominal hysterectomy        Medication List       This list is accurate as of: 05/18/15 10:18 AM.  Always use your most recent med list.               albuterol 108 (90 BASE) MCG/ACT inhaler  Commonly known as:  PROVENTIL HFA;VENTOLIN HFA  Inhale 2 puffs into the lungs every 6 (six) hours as needed for wheezing or shortness of breath.     carbamide peroxide 6.5 % otic solution  Commonly known as:  DEBROX  Place 5 drops into both ears 2 (two) times daily.     multivitamin with minerals tablet  Take 1 tablet by mouth daily.     pantoprazole 40 MG tablet  Commonly known as:  PROTONIX  Take 1 tablet (40 mg total) by mouth daily.     Vitamin D (Ergocalciferol) 50000 UNITS Caps capsule  Commonly known as:  DRISDOL  Take 1 capsule (50,000 Units total) by mouth every 7 (seven) days.        Meds ordered this encounter  Medications  . albuterol (PROVENTIL HFA;VENTOLIN HFA) 108 (90 BASE) MCG/ACT inhaler    Sig: Inhale 2 puffs into the lungs  every 6 (six) hours as needed for wheezing or shortness of breath.    Dispense:  1 Inhaler    Refill:  3     There is no immunization history on file for this patient.  Family History  Problem Relation Age of Onset  . Liver cancer Father   . Hypertension Maternal Aunt   . Colon cancer Neg Hx   . Colon polyps Mother   . Diabetes Maternal Grandfather   . Diabetes Maternal Aunt   . Kidney disease Neg Hx   . Esophageal cancer Paternal Grandfather     History  Substance Use Topics  . Smoking status: Never Smoker   . Smokeless tobacco: Never Used  . Alcohol Use: No    Review of Systems   As noted in HPI  Filed Vitals:   05/18/15 0928  BP: 130/80  Pulse:   Temp:   Resp:     Physical Exam  Physical Exam  Constitutional:  Obese female sitting comfortably not in acute distress  Eyes: EOM are normal. Pupils are equal, round, and reactive to light.  Cardiovascular: Normal rate and regular rhythm.   Pulmonary/Chest: Breath sounds normal. No respiratory distress. She has no wheezes. She has no rales.  Musculoskeletal: She exhibits no edema.    CBC  Component Value Date/Time   WBC 9.9 12/02/2014 1554   RBC 5.28* 12/02/2014 1554   HGB 15.3* 12/02/2014 1554   HCT 47.3* 12/02/2014 1554   PLT 248 12/02/2014 1554   MCV 89.6 12/02/2014 1554   LYMPHSABS 2.9 12/02/2014 1554   MONOABS 0.9 12/02/2014 1554   EOSABS 0.3 12/02/2014 1554   BASOSABS 0.1 12/02/2014 1554    CMP     Component Value Date/Time   NA 142 12/02/2014 1554   K 4.5 12/02/2014 1554   CL 102 12/02/2014 1554   CO2 29 12/02/2014 1554   GLUCOSE 88 12/02/2014 1554   BUN 10 12/02/2014 1554   CREATININE 0.52 12/02/2014 1554   CALCIUM 10.2 12/02/2014 1554   PROT 7.5 12/02/2014 1554   ALBUMIN 4.4 12/02/2014 1554   AST 27 12/02/2014 1554   ALT 30 12/02/2014 1554   ALKPHOS 55 12/02/2014 1554   BILITOT 0.6 12/02/2014 1554   GFRNONAA >89 12/02/2014 1554   GFRAA >89 12/02/2014 1554    No results  found for: CHOL  Lab Results  Component Value Date/Time   HGBA1C 5.9* 12/02/2014 03:54 PM    Lab Results  Component Value Date/Time   AST 27 12/02/2014 03:54 PM    Assessment and Plan  Prediabetes Advised patient for low carbohydrate diet  Vitamin D deficiency Patient will start taking over-the-counter vitamin D supplement  History of asthma - Plan: albuterol (PROVENTIL HFA;VENTOLIN HFA) 108 (90 BASE) MCG/ACT inhaler  SOB (shortness of breath) - Plan: albuterol (PROVENTIL HFA;VENTOLIN HFA) 108 (90 BASE) MCG/ACT inhaler   Health Maintenance -Colonoscopy:Up-to-date -Pap Smear:will be scheduled -Mammogram:up-to-date  Return in about 3 months (around 08/18/2015), or if symptoms worsen or fail to improve, for Schedule Appt with Dr Burman Freestone for PAP.   This note has been created with Surveyor, quantity. Any transcriptional errors are unintentional.    Lorayne Marek, MD

## 2015-05-18 NOTE — Patient Instructions (Signed)
Diabetes Mellitus and Food It is important for you to manage your blood sugar (glucose) level. Your blood glucose level can be greatly affected by what you eat. Eating healthier foods in the appropriate amounts throughout the day at about the same time each day will help you control your blood glucose level. It can also help slow or prevent worsening of your diabetes mellitus. Healthy eating may even help you improve the level of your blood pressure and reach or maintain a healthy weight.  HOW CAN FOOD AFFECT ME? Carbohydrates Carbohydrates affect your blood glucose level more than any other type of food. Your dietitian will help you determine how many carbohydrates to eat at each meal and teach you how to count carbohydrates. Counting carbohydrates is important to keep your blood glucose at a healthy level, especially if you are using insulin or taking certain medicines for diabetes mellitus. Alcohol Alcohol can cause sudden decreases in blood glucose (hypoglycemia), especially if you use insulin or take certain medicines for diabetes mellitus. Hypoglycemia can be a life-threatening condition. Symptoms of hypoglycemia (sleepiness, dizziness, and disorientation) are similar to symptoms of having too much alcohol.  If your health care provider has given you approval to drink alcohol, do so in moderation and use the following guidelines:  Women should not have more than one drink per day, and men should not have more than two drinks per day. One drink is equal to:  12 oz of beer.  5 oz of wine.  1 oz of hard liquor.  Do not drink on an empty stomach.  Keep yourself hydrated. Have water, diet soda, or unsweetened iced tea.  Regular soda, juice, and other mixers might contain a lot of carbohydrates and should be counted. WHAT FOODS ARE NOT RECOMMENDED? As you make food choices, it is important to remember that all foods are not the same. Some foods have fewer nutrients per serving than other  foods, even though they might have the same number of calories or carbohydrates. It is difficult to get your body what it needs when you eat foods with fewer nutrients. Examples of foods that you should avoid that are high in calories and carbohydrates but low in nutrients include:  Trans fats (most processed foods list trans fats on the Nutrition Facts label).  Regular soda.  Juice.  Candy.  Sweets, such as cake, pie, doughnuts, and cookies.  Fried foods. WHAT FOODS CAN I EAT? Have nutrient-rich foods, which will nourish your body and keep you healthy. The food you should eat also will depend on several factors, including:  The calories you need.  The medicines you take.  Your weight.  Your blood glucose level.  Your blood pressure level.  Your cholesterol level. You also should eat a variety of foods, including:  Protein, such as meat, poultry, fish, tofu, nuts, and seeds (lean animal proteins are best).  Fruits.  Vegetables.  Dairy products, such as milk, cheese, and yogurt (low fat is best).  Breads, grains, pasta, cereal, rice, and beans.  Fats such as olive oil, trans fat-free margarine, canola oil, avocado, and olives. DOES EVERYONE WITH DIABETES MELLITUS HAVE THE SAME MEAL PLAN? Because every person with diabetes mellitus is different, there is not one meal plan that works for everyone. It is very important that you meet with a dietitian who will help you create a meal plan that is just right for you. Document Released: 07/21/2005 Document Revised: 10/29/2013 Document Reviewed: 09/20/2013 ExitCare Patient Information 2015 [employer]. This   information is not intended to replace advice given to you by your health care provider. Make sure you discuss any questions you have with your health care provider. DASH Eating Plan DASH stands for "Dietary Approaches to Stop Hypertension." The DASH eating plan is a healthy eating plan that has been shown to reduce high  blood pressure (hypertension). Additional health benefits may include reducing the risk of type 2 diabetes mellitus, heart disease, and stroke. The DASH eating plan may also help with weight loss. WHAT DO I NEED TO KNOW ABOUT THE DASH EATING PLAN? For the DASH eating plan, you will follow these general guidelines:  Choose foods with a percent daily value for sodium of less than 5% (as listed on the food label).  Use salt-free seasonings or herbs instead of table salt or sea salt.  Check with your health care provider or pharmacist before using salt substitutes.  Eat lower-sodium products, often labeled as "lower sodium" or "no salt added."  Eat fresh foods.  Eat more vegetables, fruits, and low-fat dairy products.  Choose whole grains. Look for the word "whole" as the first word in the ingredient list.  Choose fish and skinless chicken or turkey more often than red meat. Limit fish, poultry, and meat to 6 oz (170 g) each day.  Limit sweets, desserts, sugars, and sugary drinks.  Choose heart-healthy fats.  Limit cheese to 1 oz (28 g) per day.  Eat more home-cooked food and less restaurant, buffet, and fast food.  Limit fried foods.  Cook foods using methods other than frying.  Limit canned vegetables. If you do use them, rinse them well to decrease the sodium.  When eating at a restaurant, ask that your food be prepared with less salt, or no salt if possible. WHAT FOODS CAN I EAT? Seek help from a dietitian for individual calorie needs. Grains Whole grain or whole wheat bread. Brown rice. Whole grain or whole wheat pasta. Quinoa, bulgur, and whole grain cereals. Low-sodium cereals. Corn or whole wheat flour tortillas. Whole grain cornbread. Whole grain crackers. Low-sodium crackers. Vegetables Fresh or frozen vegetables (raw, steamed, roasted, or grilled). Low-sodium or reduced-sodium tomato and vegetable juices. Low-sodium or reduced-sodium tomato sauce and paste. Low-sodium  or reduced-sodium canned vegetables.  Fruits All fresh, canned (in natural juice), or frozen fruits. Meat and Other Protein Products Ground beef (85% or leaner), grass-fed beef, or beef trimmed of fat. Skinless chicken or turkey. Ground chicken or turkey. Pork trimmed of fat. All fish and seafood. Eggs. Dried beans, peas, or lentils. Unsalted nuts and seeds. Unsalted canned beans. Dairy Low-fat dairy products, such as skim or 1% milk, 2% or reduced-fat cheeses, low-fat ricotta or cottage cheese, or plain low-fat yogurt. Low-sodium or reduced-sodium cheeses. Fats and Oils Tub margarines without trans fats. Light or reduced-fat mayonnaise and salad dressings (reduced sodium). Avocado. Safflower, olive, or canola oils. Natural peanut or almond butter. Other Unsalted popcorn and pretzels. The items listed above may not be a complete list of recommended foods or beverages. Contact your dietitian for more options. WHAT FOODS ARE NOT RECOMMENDED? Grains White bread. White pasta. White rice. Refined cornbread. Bagels and croissants. Crackers that contain trans fat. Vegetables Creamed or fried vegetables. Vegetables in a cheese sauce. Regular canned vegetables. Regular canned tomato sauce and paste. Regular tomato and vegetable juices. Fruits Dried fruits. Canned fruit in light or heavy syrup. Fruit juice. Meat and Other Protein Products Fatty cuts of meat. Ribs, chicken wings, bacon, sausage, bologna, salami, chitterlings, fatback, hot   dogs, bratwurst, and packaged luncheon meats. Salted nuts and seeds. Canned beans with salt. Dairy Whole or 2% milk, cream, half-and-half, and cream cheese. Whole-fat or sweetened yogurt. Full-fat cheeses or blue cheese. Nondairy creamers and whipped toppings. Processed cheese, cheese spreads, or cheese curds. Condiments Onion and garlic salt, seasoned salt, table salt, and sea salt. Canned and packaged gravies. Worcestershire sauce. Tartar sauce. Barbecue sauce.  Teriyaki sauce. Soy sauce, including reduced sodium. Steak sauce. Fish sauce. Oyster sauce. Cocktail sauce. Horseradish. Ketchup and mustard. Meat flavorings and tenderizers. Bouillon cubes. Hot sauce. Tabasco sauce. Marinades. Taco seasonings. Relishes. Fats and Oils Butter, stick margarine, lard, shortening, ghee, and bacon fat. Coconut, palm kernel, or palm oils. Regular salad dressings. Other Pickles and olives. Salted popcorn and pretzels. The items listed above may not be a complete list of foods and beverages to avoid. Contact your dietitian for more information. WHERE CAN I FIND MORE INFORMATION? National Heart, Lung, and Blood Institute: www.nhlbi.nih.gov/health/health-topics/topics/dash/ Document Released: 10/13/2011 Document Revised: 03/10/2014 Document Reviewed: 08/28/2013 ExitCare Patient Information 2015 [employer]. This information is not intended to replace advice given to you by your health care provider. Make sure you discuss any questions you have with your health care provider.  

## 2015-05-18 NOTE — Telephone Encounter (Signed)
Pt reviewed AVS and noticed that she has pantoprazole (PROTONIX) 40 MG tablet listed as current medication.  Pt states she has never taken this medication before and would like to know what it is used for. Please f/u with pt.

## 2015-05-25 ENCOUNTER — Other Ambulatory Visit: Payer: Self-pay | Admitting: Family Medicine

## 2015-05-29 ENCOUNTER — Telehealth: Payer: Self-pay

## 2015-05-29 ENCOUNTER — Ambulatory Visit (HOSPITAL_BASED_OUTPATIENT_CLINIC_OR_DEPARTMENT_OTHER): Payer: Self-pay | Admitting: Family Medicine

## 2015-05-29 VITALS — Ht 67.5 in | Wt 288.0 lb

## 2015-05-29 DIAGNOSIS — Z0189 Encounter for other specified special examinations: Secondary | ICD-10-CM

## 2015-05-29 NOTE — Progress Notes (Signed)
Annual physical  Hx Hysterectomy due to heavy menses at age 54

## 2015-06-01 ENCOUNTER — Ambulatory Visit: Payer: Self-pay

## 2015-06-02 ENCOUNTER — Ambulatory Visit: Payer: Self-pay | Attending: Internal Medicine

## 2015-06-11 NOTE — Progress Notes (Signed)
Patient ID: Jasmine Martin, female   DOB: Apr 21, 1961, 54 y.o.   MRN: [id number] Cancelled by patient

## 2015-07-23 ENCOUNTER — Ambulatory Visit: Payer: Self-pay | Admitting: Gastroenterology

## 2015-10-14 ENCOUNTER — Ambulatory Visit: Payer: Self-pay | Attending: Family Medicine | Admitting: Family Medicine

## 2015-10-14 ENCOUNTER — Encounter: Payer: Self-pay | Admitting: Family Medicine

## 2015-10-14 VITALS — BP 127/88 | HR 87 | Temp 98.1°F | Resp 18 | Ht 67.0 in | Wt 286.7 lb

## 2015-10-14 DIAGNOSIS — Z8709 Personal history of other diseases of the respiratory system: Secondary | ICD-10-CM

## 2015-10-14 DIAGNOSIS — J181 Lobar pneumonia, unspecified organism: Secondary | ICD-10-CM

## 2015-10-14 DIAGNOSIS — R0602 Shortness of breath: Secondary | ICD-10-CM

## 2015-10-14 DIAGNOSIS — J189 Pneumonia, unspecified organism: Secondary | ICD-10-CM

## 2015-10-14 DIAGNOSIS — R05 Cough: Secondary | ICD-10-CM | POA: Insufficient documentation

## 2015-10-14 DIAGNOSIS — T65811A Toxic effect of latex, accidental (unintentional), initial encounter: Secondary | ICD-10-CM | POA: Insufficient documentation

## 2015-10-14 DIAGNOSIS — J45909 Unspecified asthma, uncomplicated: Secondary | ICD-10-CM | POA: Insufficient documentation

## 2015-10-14 MED ORDER — ALBUTEROL SULFATE HFA 108 (90 BASE) MCG/ACT IN AERS: 2.0000 | INHALATION_SPRAY | Freq: Four times a day (QID) | RESPIRATORY_TRACT | Status: DC | PRN

## 2015-10-14 MED ORDER — HYDROCORTISONE 2.5 % EX CREA: TOPICAL_CREAM | Freq: Two times a day (BID) | CUTANEOUS | Status: DC

## 2015-10-14 MED ORDER — AZITHROMYCIN 250 MG PO TABS: ORAL_TABLET | ORAL | Status: DC

## 2015-10-14 MED ORDER — BENZONATATE 100 MG PO CAPS: 100.0000 mg | ORAL_CAPSULE | Freq: Two times a day (BID) | ORAL | Status: DC | PRN

## 2015-10-14 MED ORDER — CEFTRIAXONE SODIUM 1 G IJ SOLR
1.0000 g | Freq: Once | INTRAMUSCULAR | Status: AC
  Administered 2015-10-14: 1 g via INTRAMUSCULAR

## 2015-10-14 MED ORDER — DIPHENHYDRAMINE HCL 25 MG PO CAPS: 25.0000 mg | ORAL_CAPSULE | Freq: Once | ORAL | Status: DC

## 2015-10-14 NOTE — Progress Notes (Signed)
   Subjective:    Patient ID: Jasmine Martin, female    DOB: August 18, 1961, 54 y.o.   MRN: [id number]  HPI Patient seen as SDA for worsening cough and sputum production, has been ongoing for a couple of weeks.  Felt like she was having fevers last week, then seemed to get better briefly before getting worse. Cough is much worse, she is getting short of breath with the heavy coughing. No chest pain other than with cough.  No sick contacts.   She has history of asthma, uses albuterol MDI with minimal relief.  Counter on her MDI reads 128.     Review of SystemsSee above     Objective:   Physical Exam Mildly ill-appearing, no apparent distress. Coughing paroxysms.  HEENT Neck supple, no cervical adenopathy. Nasal mucosa edematous. R maxillary sinus tenderness. Clear oropharynx.  COR regular S1S2, no extra sounds PULM Crackles along L lung base; no wheezes or rhonchi.       Assessment & Plan:

## 2015-10-14 NOTE — Progress Notes (Signed)
Patient here for cold symptoms.  Patient has had productive cough and congestion present for 2 weeks. Patient has had no relief.

## 2015-10-14 NOTE — Patient Instructions (Addendum)
It was a pleasure to see you today.  Your history and exam is consistent with a diagnosis of pneumonia.  You are being given an injection of the antibiotic CEFTRIAXONE 1 gram.  Also, a prescription for azithromycin 250mg  tablets, take 2 tablets by mouth on Day One, and then 1 tablet daily for the next 4 days.   I refilled your albuterol inhaler, please use 2 puffs every 4 hours as needed for shortness of breath.  Tessalon capsules 100mg , take 1 capsule by mouth twice daily as needed for the cough.   Follow up in 1-2 weeks, or sooner if you experience fevers/chills or worsening shortness of breath.  Call 911 if sudden worsening of shortness of breath.

## 2015-10-15 ENCOUNTER — Telehealth: Payer: Self-pay | Admitting: Internal Medicine

## 2015-10-15 MED ORDER — PANTOPRAZOLE SODIUM 40 MG PO TBEC: 40.0000 mg | DELAYED_RELEASE_TABLET | Freq: Every day | ORAL | Status: DC

## 2015-10-15 NOTE — Telephone Encounter (Signed)
Patient came in yesterday and saw dr breen as a walkin but her rx's were not filled completely and is hoping they can be filled today before 11:45am that way a family member can pick them up for her since she is sick. If not she is wondering if the rx's can be sent to her house somehow. Please follow up with pt. Thank you.  Vitamin D, Ergocalciferol, (DRISDOL) 50000 UNITS CAPS capsule pantoprazole (PROTONIX) 40 MG tablet benzonatate (TESSALON) 100 MG capsule

## 2015-10-15 NOTE — Telephone Encounter (Signed)
Pt notified rx was send yesterday to [facility]  Rx refill send

## 2015-11-03 NOTE — Telephone Encounter (Signed)
error 

## 2015-12-18 ENCOUNTER — Ambulatory Visit: Payer: Self-pay | Admitting: Family Medicine

## 2015-12-21 MED FILL — VENTOLIN HFA 90 MCG INHALER: 108 (90 BAS | 25 days supply | Qty: 18 | Fill #0

## 2015-12-23 ENCOUNTER — Ambulatory Visit: Payer: Self-pay

## 2015-12-23 ENCOUNTER — Other Ambulatory Visit: Payer: Self-pay | Admitting: *Deleted

## 2015-12-23 DIAGNOSIS — R0602 Shortness of breath: Secondary | ICD-10-CM

## 2015-12-23 DIAGNOSIS — Z8709 Personal history of other diseases of the respiratory system: Secondary | ICD-10-CM

## 2015-12-23 MED ORDER — ALBUTEROL SULFATE HFA 108 (90 BASE) MCG/ACT IN AERS: 2.0000 | INHALATION_SPRAY | Freq: Four times a day (QID) | RESPIRATORY_TRACT | Status: DC | PRN

## 2016-01-01 ENCOUNTER — Ambulatory Visit: Payer: Self-pay | Attending: Family Medicine | Admitting: Family Medicine

## 2016-01-01 ENCOUNTER — Encounter: Payer: Self-pay | Admitting: Family Medicine

## 2016-01-01 VITALS — BP 135/84 | HR 78 | Temp 98.5°F | Resp 16 | Ht 67.0 in | Wt 291.0 lb

## 2016-01-01 DIAGNOSIS — Z79899 Other long term (current) drug therapy: Secondary | ICD-10-CM | POA: Insufficient documentation

## 2016-01-01 DIAGNOSIS — J069 Acute upper respiratory infection, unspecified: Secondary | ICD-10-CM | POA: Insufficient documentation

## 2016-01-01 DIAGNOSIS — R0602 Shortness of breath: Secondary | ICD-10-CM | POA: Insufficient documentation

## 2016-01-01 DIAGNOSIS — R0789 Other chest pain: Secondary | ICD-10-CM | POA: Insufficient documentation

## 2016-01-01 DIAGNOSIS — M25561 Pain in right knee: Secondary | ICD-10-CM | POA: Insufficient documentation

## 2016-01-01 DIAGNOSIS — R05 Cough: Secondary | ICD-10-CM | POA: Insufficient documentation

## 2016-01-01 MED ORDER — BENZONATATE 100 MG PO CAPS: 100.0000 mg | ORAL_CAPSULE | Freq: Two times a day (BID) | ORAL | Status: DC | PRN

## 2016-01-01 MED ORDER — AMOXICILLIN 500 MG PO CAPS: 500.0000 mg | ORAL_CAPSULE | Freq: Three times a day (TID) | ORAL | Status: DC

## 2016-01-01 MED ORDER — IBUPROFEN 600 MG PO TABS: 600.0000 mg | ORAL_TABLET | Freq: Three times a day (TID) | ORAL | Status: DC | PRN

## 2016-01-01 MED FILL — BENZONATATE 100 MG CAPSULE: 100 | 10 days supply | Qty: 20 | Fill #0

## 2016-01-01 MED FILL — IBUPROFEN 600 MG TABLET: 600 | 10 days supply | Qty: 30 | Fill #0

## 2016-01-01 MED FILL — AMOXICILLIN 500 MG CAPSULE: 500 | 10 days supply | Qty: 30 | Fill #0

## 2016-01-01 NOTE — Patient Instructions (Addendum)
Jasmine Martin was seen today for cough.  Diagnoses and all orders for this visit:  Acute upper respiratory infection -     amoxicillin (AMOXIL) 500 MG capsule; Take 1 capsule (500 mg total) by mouth 3 (three) times daily. -     benzonatate (TESSALON) 100 MG capsule; Take 1 capsule (100 mg total) by mouth 2 (two) times daily as needed for cough.  Right knee pain -     ibuprofen (ADVIL,MOTRIN) 600 MG tablet; Take 1 tablet (600 mg total) by mouth every 8 (eight) hours as needed.    F/u for chest pain, shortness of breath, persistent high fever.   F/u in 4 weeks for respiratory infection follow up and R knee pain  Dr. Adrian Blackwater

## 2016-01-01 NOTE — Assessment & Plan Note (Signed)
URI with cough, lingering. No fever. No hypoxemia  Plan: amox Tessalon perles Ibuprofen

## 2016-01-01 NOTE — Progress Notes (Signed)
C/C cough and torso pain  2 wk Hx pneumonia  Pain scale # 9 No tobacco user  No suicidal thought in the past two weeks

## 2016-01-01 NOTE — Progress Notes (Signed)
Subjective:  Patient ID: Jasmine Martin, female    DOB: [birth year]  Age: 55 y.o. MRN: [id number]  CC: Cough   HPI Jasmine Martin presents for   Cough: x 2 weeks. With SOB and chest discomfort. Also with congestion. Subjective fever. 51 yo mother with similar symptoms at home. Non smoker. Albuterol helps temporarily with SOB.   Social History  Substance Use Topics  . Smoking status: Never Smoker   . Smokeless tobacco: Never Used  . Alcohol Use: No    Outpatient Prescriptions Prior to Visit  Medication Sig Dispense Refill  . albuterol (PROVENTIL HFA;VENTOLIN HFA) 108 (90 Base) MCG/ACT inhaler Inhale 2 puffs into the lungs every 6 (six) hours as needed for wheezing or shortness of breath. 54 g 3  . azithromycin (ZITHROMAX) 250 MG tablet Take 2 tablets by mouth on day one, then 1 tab daily for 4 more days 6 each 0  . benzonatate (TESSALON) 100 MG capsule Take 1 capsule (100 mg total) by mouth 2 (two) times daily as needed for cough. 20 capsule 0  . carbamide peroxide (DEBROX) 6.5 % otic solution Place 5 drops into both ears 2 (two) times daily. 15 mL 1  . hydrocortisone 2.5 % cream Apply topically 2 (two) times daily. 30 g 0  . Multiple Vitamins-Minerals (MULTIVITAMIN WITH MINERALS) tablet Take 1 tablet by mouth daily.      . pantoprazole (PROTONIX) 40 MG tablet Take 1 tablet (40 mg total) by mouth daily. 90 tablet 3  . Vitamin D, Ergocalciferol, (DRISDOL) 50000 UNITS CAPS capsule Take 1 capsule (50,000 Units total) by mouth every 7 (seven) days. 12 capsule 0   Facility-Administered Medications Prior to Visit  Medication Dose Route Frequency Provider Last Rate Last Dose  . diphenhydrAMINE (BENADRYL) capsule 25 mg  25 mg Oral Once Willeen Niece, MD        ROS Review of Systems  Constitutional: Positive for fever. Negative for chills.  HENT: Positive for congestion and ear pain.   Eyes: Negative for visual disturbance.  Respiratory: Positive for cough and wheezing. Negative  for shortness of breath.   Cardiovascular: Positive for chest pain.  Gastrointestinal: Negative for abdominal pain and blood in stool.  Musculoskeletal: Positive for arthralgias (R knee pain ). Negative for back pain.  Skin: Negative for rash.  Allergic/Immunologic: Negative for immunocompromised state.  Hematological: Negative for adenopathy. Does not bruise/bleed easily.  Psychiatric/Behavioral: Negative for suicidal ideas and dysphoric mood.    Objective:  BP 135/84 mmHg  Pulse 78  Temp(Src) 98.5 F (36.9 C) (Oral)  Resp 16  Ht 5\' 7"  (1.702 m)  Wt 291 lb (131.997 kg)  BMI 45.57 kg/m2  BP/Weight 01/01/2016 10/14/2015 123XX123  Systolic BP [id number] -  Diastolic BP 84 88 -  Wt. (Lbs) 291 286.7 288  BMI 45.57 44.89 44.42   Physical Exam  Constitutional: She is oriented to person, place, and time. She appears well-developed and well-nourished. No distress.  HENT:  Head: Normocephalic and atraumatic.  Cardiovascular: Normal rate, regular rhythm, normal heart sounds and intact distal pulses.   Pulmonary/Chest: Effort normal. No respiratory distress. She has wheezes. She has no rales. She exhibits no tenderness.  Musculoskeletal: She exhibits no edema.  Neurological: She is alert and oriented to person, place, and time.  Skin: Skin is warm and dry. No rash noted.  Psychiatric: She has a normal mood and affect.   Assessment & Plan:   Saddie was seen today for cough.  Diagnoses  and all orders for this visit:  Acute upper respiratory infection -     amoxicillin (AMOXIL) 500 MG capsule; Take 1 capsule (500 mg total) by mouth 3 (three) times daily. -     benzonatate (TESSALON) 100 MG capsule; Take 1 capsule (100 mg total) by mouth 2 (two) times daily as needed for cough.  Right knee pain -     ibuprofen (ADVIL,MOTRIN) 600 MG tablet; Take 1 tablet (600 mg total) by mouth every 8 (eight) hours as needed.   Follow-up: No Follow-up on file.   Boykin Nearing MD

## 2016-01-11 ENCOUNTER — Encounter: Payer: Self-pay | Admitting: Family Medicine

## 2016-01-11 ENCOUNTER — Ambulatory Visit: Payer: Self-pay | Attending: Family Medicine | Admitting: Family Medicine

## 2016-01-11 ENCOUNTER — Ambulatory Visit (HOSPITAL_COMMUNITY)
Admission: RE | Admit: 2016-01-11 | Discharge: 2016-01-11 | Disposition: A | Discharge status: Home / Self Care | Payer: Self-pay | Source: Ambulatory Visit | Attending: Family Medicine | Admitting: Family Medicine

## 2016-01-11 ENCOUNTER — Telehealth: Payer: Self-pay | Admitting: Family Medicine

## 2016-01-11 VITALS — BP 133/80 | HR 86 | Temp 98.5°F | Resp 18 | Ht 68.5 in | Wt 295.0 lb

## 2016-01-11 DIAGNOSIS — Z79899 Other long term (current) drug therapy: Secondary | ICD-10-CM | POA: Insufficient documentation

## 2016-01-11 DIAGNOSIS — K219 Gastro-esophageal reflux disease without esophagitis: Secondary | ICD-10-CM | POA: Insufficient documentation

## 2016-01-11 DIAGNOSIS — J069 Acute upper respiratory infection, unspecified: Secondary | ICD-10-CM | POA: Insufficient documentation

## 2016-01-11 DIAGNOSIS — R05 Cough: Secondary | ICD-10-CM | POA: Insufficient documentation

## 2016-01-11 DIAGNOSIS — R059 Cough, unspecified: Secondary | ICD-10-CM

## 2016-01-11 MED ORDER — PANTOPRAZOLE SODIUM 40 MG PO TBEC: 40.0000 mg | DELAYED_RELEASE_TABLET | Freq: Every day | ORAL | Status: DC

## 2016-01-11 MED ORDER — CETIRIZINE HCL 10 MG PO TABS: 10.0000 mg | ORAL_TABLET | Freq: Every day | ORAL | Status: DC

## 2016-01-11 MED ORDER — BENZONATATE 100 MG PO CAPS: 100.0000 mg | ORAL_CAPSULE | Freq: Two times a day (BID) | ORAL | Status: DC | PRN

## 2016-01-11 MED FILL — ?CETIRIZINE HCL 10 MG TABLE: 10 | 30 days supply | Qty: 30 | Fill #0

## 2016-01-11 MED FILL — BENZONATATE 100 MG CAPSULE: 100 | 10 days supply | Qty: 20 | Fill #0

## 2016-01-11 MED FILL — ?PANTOPRAZOLE SOD DR 40MG: 40 MG | 30 days supply | Qty: 30 | Fill #0

## 2016-01-11 NOTE — Progress Notes (Signed)
Subjective:  Patient ID: Jasmine Martin, female    DOB: Jun 25, 1961  Age: 55 y.o. MRN: [id number]  CC: Cough   HPI LAVAYA SIT presents for   1. Cough: she has persistent cough for the past 3 weeks. She is taking amoxicillin, she has about two doses left. She has slight improvement of symptom since last OV. Report objective fever. Having coughing fits. Feels lightheaded after cough. Her mother who was also sick is now well. She has CP and feels SOB during cough. Tessalon perles help with cough. Albuterol is not helping much.    Social History  Substance Use Topics  . Smoking status: Never Smoker   . Smokeless tobacco: Never Used  . Alcohol Use: No    Outpatient Prescriptions Prior to Visit  Medication Sig Dispense Refill  . albuterol (PROVENTIL HFA;VENTOLIN HFA) 108 (90 Base) MCG/ACT inhaler Inhale 2 puffs into the lungs every 6 (six) hours as needed for wheezing or shortness of breath. 54 g 3  . amoxicillin (AMOXIL) 500 MG capsule Take 1 capsule (500 mg total) by mouth 3 (three) times daily. 30 capsule 0  . benzonatate (TESSALON) 100 MG capsule Take 1 capsule (100 mg total) by mouth 2 (two) times daily as needed for cough. 20 capsule 0  . ibuprofen (ADVIL,MOTRIN) 600 MG tablet Take 1 tablet (600 mg total) by mouth every 8 (eight) hours as needed. 30 tablet 0  . Multiple Vitamins-Minerals (MULTIVITAMIN WITH MINERALS) tablet Take 1 tablet by mouth daily. Reported on 01/01/2016    . pantoprazole (PROTONIX) 40 MG tablet Take 1 tablet (40 mg total) by mouth daily. 90 tablet 3  . Vitamin D, Ergocalciferol, (DRISDOL) 50000 UNITS CAPS capsule Take 1 capsule (50,000 Units total) by mouth every 7 (seven) days. (Patient not taking: Reported on 01/11/2016) 12 capsule 0   No facility-administered medications prior to visit.    ROS Review of Systems  Constitutional: Positive for fever. Negative for chills.  HENT: Positive for congestion and ear pain.   Eyes: Negative for visual  disturbance.  Respiratory: Positive for cough and wheezing. Negative for shortness of breath.   Cardiovascular: Positive for chest pain.  Gastrointestinal: Negative for abdominal pain and blood in stool.  Musculoskeletal: Positive for arthralgias (R knee pain ) and neck pain. Negative for back pain.  Skin: Negative for rash.  Allergic/Immunologic: Negative for immunocompromised state.  Hematological: Negative for adenopathy. Does not bruise/bleed easily.  Psychiatric/Behavioral: Negative for suicidal ideas and dysphoric mood.    Objective:  BP 133/80 mmHg  Pulse 86  Temp(Src) 98.5 F (36.9 C) (Oral)  Resp 18  Ht 5' 8.5" (1.74 m)  Wt 295 lb (133.811 kg)  BMI 44.20 kg/m2  SpO2 96%  BP/Weight 01/11/2016 01/01/2016 123XX123  Systolic BP Q000111Q [id number]  Diastolic BP 80 84 88  Wt. (Lbs) 295 291 286.7  BMI 44.2 45.57 44.89   Physical Exam  Constitutional: She is oriented to person, place, and time. She appears well-developed and well-nourished. No distress.  Obese   HENT:  Head: Normocephalic and atraumatic.  Right Ear: Tympanic membrane, external ear and ear canal normal.  Left Ear: Tympanic membrane, external ear and ear canal normal.  Nose: Mucosal edema present.  Mouth/Throat: Oropharynx is clear and moist and mucous membranes are normal.  Cardiovascular: Normal rate, regular rhythm, normal heart sounds and intact distal pulses.   Pulmonary/Chest: Effort normal. No respiratory distress. She has wheezes. She has no rales. She exhibits no tenderness.  Musculoskeletal: She exhibits  no edema.  Lymphadenopathy:    She has no cervical adenopathy.  Neurological: She is alert and oriented to person, place, and time.  Skin: Skin is warm and dry. No rash noted.  Psychiatric: She has a normal mood and affect.   Lab Results  Component Value Date   HGBA1C 5.9* 12/02/2014   Albuterol neb treatment x one   Assessment & Plan:   Kyrianna was seen today for cough.  Diagnoses and all  orders for this visit:  Acute upper respiratory infection -     DG Chest 2 View; Future -     benzonatate (TESSALON) 100 MG capsule; Take 1 capsule (100 mg total) by mouth 2 (two) times daily as needed for cough.  Cough -     DG Chest 2 View; Future -     cetirizine (ZYRTEC) 10 MG tablet; Take 1 tablet (10 mg total) by mouth daily.  Gastroesophageal reflux disease, esophagitis presence not specified -     pantoprazole (PROTONIX) 40 MG tablet; Take 1 tablet (40 mg total) by mouth daily.   Follow-up: No Follow-up on file.   Boykin Nearing MD

## 2016-01-11 NOTE — Progress Notes (Signed)
C/C productive cough, possible sinuse infection  Neck swelling and painful Pain scale #9 No tobacco user  No suicide thoughts in the past two weeks

## 2016-01-11 NOTE — Assessment & Plan Note (Signed)
persistent acute resp infection symptoms. I still believe this is upper resp, but given persistent nature of symptoms, CXR ordered. Patient advised to finish amox. Refilled tessalon perles and added zyrtec and protonix.

## 2016-01-11 NOTE — Patient Instructions (Signed)
Jasmine Martin was seen today for cough.  Diagnoses and all orders for this visit:  Acute upper respiratory infection -     DG Chest 2 View; Future -     benzonatate (TESSALON) 100 MG capsule; Take 1 capsule (100 mg total) by mouth 2 (two) times daily as needed for cough.  Cough -     DG Chest 2 View; Future -     cetirizine (ZYRTEC) 10 MG tablet; Take 1 tablet (10 mg total) by mouth daily.  Gastroesophageal reflux disease, esophagitis presence not specified -     pantoprazole (PROTONIX) 40 MG tablet; Take 1 tablet (40 mg total) by mouth daily.   F/u in 3 weeks for cough  Dr. Adrian Blackwater

## 2016-01-11 NOTE — Telephone Encounter (Signed)
Patient came into office requesting xray results and would also want to know where she can get vit d . Please f/u

## 2016-01-13 NOTE — Telephone Encounter (Signed)
Date of birth verified by pt  X-ray results given  Pt verbalized understanding

## 2016-01-13 NOTE — Telephone Encounter (Signed)
-----   Message from Boykin Nearing, MD sent at 01/11/2016  2:18 PM EST ----- No fluid in lungs or pneumonia on CXR

## 2016-01-18 ENCOUNTER — Ambulatory Visit: Payer: Self-pay

## 2016-01-22 ENCOUNTER — Ambulatory Visit: Payer: Self-pay | Attending: Family Medicine

## 2016-02-02 ENCOUNTER — Encounter: Payer: Self-pay | Admitting: Family Medicine

## 2016-02-02 ENCOUNTER — Encounter: Payer: Self-pay | Admitting: Clinical

## 2016-02-02 ENCOUNTER — Ambulatory Visit: Payer: Self-pay | Attending: Family Medicine | Admitting: Family Medicine

## 2016-02-02 VITALS — BP 126/82 | HR 77 | Temp 98.5°F | Resp 16 | Ht 67.0 in | Wt 295.0 lb

## 2016-02-02 DIAGNOSIS — M25561 Pain in right knee: Secondary | ICD-10-CM

## 2016-02-02 DIAGNOSIS — J069 Acute upper respiratory infection, unspecified: Secondary | ICD-10-CM

## 2016-02-02 DIAGNOSIS — M549 Dorsalgia, unspecified: Secondary | ICD-10-CM

## 2016-02-02 DIAGNOSIS — M546 Pain in thoracic spine: Secondary | ICD-10-CM

## 2016-02-02 DIAGNOSIS — R059 Cough, unspecified: Secondary | ICD-10-CM

## 2016-02-02 DIAGNOSIS — R05 Cough: Secondary | ICD-10-CM

## 2016-02-02 DIAGNOSIS — K219 Gastro-esophageal reflux disease without esophagitis: Secondary | ICD-10-CM

## 2016-02-02 DIAGNOSIS — J029 Acute pharyngitis, unspecified: Secondary | ICD-10-CM | POA: Insufficient documentation

## 2016-02-02 DIAGNOSIS — Z7951 Long term (current) use of inhaled steroids: Secondary | ICD-10-CM | POA: Insufficient documentation

## 2016-02-02 DIAGNOSIS — J302 Other seasonal allergic rhinitis: Secondary | ICD-10-CM

## 2016-02-02 DIAGNOSIS — M542 Cervicalgia: Secondary | ICD-10-CM | POA: Insufficient documentation

## 2016-02-02 MED ORDER — IBUPROFEN 600 MG PO TABS: 600.0000 mg | ORAL_TABLET | Freq: Three times a day (TID) | ORAL | Status: DC | PRN

## 2016-02-02 MED ORDER — PANTOPRAZOLE SODIUM 40 MG PO TBEC: 40.0000 mg | DELAYED_RELEASE_TABLET | Freq: Every day | ORAL | Status: DC

## 2016-02-02 MED ORDER — CYCLOBENZAPRINE HCL 10 MG PO TABS: 10.0000 mg | ORAL_TABLET | Freq: Three times a day (TID) | ORAL | Status: DC | PRN

## 2016-02-02 MED ORDER — CETIRIZINE HCL 10 MG PO TABS: 10.0000 mg | ORAL_TABLET | Freq: Every day | ORAL | Status: DC

## 2016-02-02 MED FILL — CYCLOBENZAPRINE 10 MG TAB: 10 | 5 days supply | Qty: 15 | Fill #0

## 2016-02-02 MED FILL — ?CETIRIZINE HCL 10 MG TABLE: 10 | 30 days supply | Qty: 30 | Fill #0

## 2016-02-02 MED FILL — VENTOLIN HFA 90 MCG INHALER: 108 (90 BAS | 25 days supply | Qty: 18 | Fill #1

## 2016-02-02 MED FILL — ?PANTOPRAZOLE SOD DR 40MG: 40 MG | 30 days supply | Qty: 30 | Fill #0

## 2016-02-02 MED FILL — IBUPROFEN 600 MG TABLET: 600 | 10 days supply | Qty: 30 | Fill #0

## 2016-02-02 NOTE — Patient Instructions (Addendum)
Alondria was seen today for cough and neck pain.  Diagnoses and all orders for this visit:  Right knee pain -     ibuprofen (ADVIL,MOTRIN) 600 MG tablet; Take 1 tablet (600 mg total) by mouth every 8 (eight) hours as needed.  Cough -     cetirizine (ZYRTEC) 10 MG tablet; Take 1 tablet (10 mg total) by mouth daily.  Gastroesophageal reflux disease, esophagitis presence not specified -     pantoprazole (PROTONIX) 40 MG tablet; Take 1 tablet (40 mg total) by mouth daily.  Upper back pain on right side -     cyclobenzaprine (FLEXERIL) 10 MG tablet; Take 1 tablet (10 mg total) by mouth 3 (three) times daily as needed for muscle spasms.   Gargle with warm salt water for your sore throat  F.u in 3 months for knee pain, sooner as needed for upper back pain or sore throat   Dr. Adrian Blackwater

## 2016-02-02 NOTE — Assessment & Plan Note (Signed)
A; MSK pain in R upper back P: NSAID Flexeril x 7-10 days  Reassurance

## 2016-02-02 NOTE — Progress Notes (Signed)
Depression screen [facility] 2/9 02/02/2016 10/14/2015  Decreased Interest 0 0  Down, Depressed, Hopeless 0 0  PHQ - 2 Score 0 0  Altered sleeping 0 -  Tired, decreased energy 3 -  Change in appetite 1 -  Feeling bad or failure about yourself  0 -  Trouble concentrating 0 -  Moving slowly or fidgety/restless 0 -  Suicidal thoughts 0 -  PHQ-9 Score 4 -    GAD 7 : Generalized Anxiety Score 02/02/2016  Nervous, Anxious, on Edge 0  Control/stop worrying 0  Worry too much - different things 0  Trouble relaxing 0  Restless 0  Easily annoyed or irritable 0  Afraid - awful might happen 0  Total GAD 7 Score 0  Anxiety Difficulty Not difficult at all

## 2016-02-02 NOTE — Progress Notes (Signed)
F/U Cough  Stated feeling better  Neck pain, possible a cyst  Pain scale #9 Medication refills  No tobacco user  No suicidal thoughts in the past two weeks

## 2016-02-02 NOTE — Progress Notes (Signed)
Subjective:  Patient ID: Jasmine Martin, female    DOB: 1961-09-06  Age: 55 y.o. MRN: [id number]  CC: Cough and Neck Pain   HPI Jasmine Martin presents for   1. Cough: improved. No fever or chills. Mild sore throat. No CP or SOB.  2. R upper back pain: x pat 6-8 weeks. Has fullness and tenderness. No trauma. No rash.   Social History  Substance Use Topics  . Smoking status: Never Smoker   . Smokeless tobacco: Never Used  . Alcohol Use: No    Outpatient Prescriptions Prior to Visit  Medication Sig Dispense Refill  . albuterol (PROVENTIL HFA;VENTOLIN HFA) 108 (90 Base) MCG/ACT inhaler Inhale 2 puffs into the lungs every 6 (six) hours as needed for wheezing or shortness of breath. 54 g 3  . cetirizine (ZYRTEC) 10 MG tablet Take 1 tablet (10 mg total) by mouth daily. 30 tablet 5  . ibuprofen (ADVIL,MOTRIN) 600 MG tablet Take 1 tablet (600 mg total) by mouth every 8 (eight) hours as needed. 30 tablet 0  . Multiple Vitamins-Minerals (MULTIVITAMIN WITH MINERALS) tablet Take 1 tablet by mouth daily. Reported on 01/01/2016    . pantoprazole (PROTONIX) 40 MG tablet Take 1 tablet (40 mg total) by mouth daily. 30 tablet 5  . benzonatate (TESSALON) 100 MG capsule Take 1 capsule (100 mg total) by mouth 2 (two) times daily as needed for cough. (Patient not taking: Reported on 02/02/2016) 20 capsule 0  . amoxicillin (AMOXIL) 500 MG capsule Take 1 capsule (500 mg total) by mouth 3 (three) times daily. (Patient not taking: Reported on 02/02/2016) 30 capsule 0   No facility-administered medications prior to visit.    ROS Review of Systems  Constitutional: Negative for fever and chills.  HENT: Positive for sore throat. Negative for congestion and ear pain.   Eyes: Negative for visual disturbance.  Respiratory: Negative for cough, shortness of breath and wheezing.   Cardiovascular: Negative for chest pain.  Gastrointestinal: Negative for abdominal pain and blood in stool.  Musculoskeletal:  Positive for back pain, arthralgias (R knee pain ) and neck pain.  Skin: Negative for rash.  Allergic/Immunologic: Negative for immunocompromised state.  Hematological: Negative for adenopathy. Does not bruise/bleed easily.  Psychiatric/Behavioral: Negative for suicidal ideas and dysphoric mood.    Objective:  BP 126/82 mmHg  Pulse 77  Temp(Src) 98.5 F (36.9 C) (Oral)  Resp 16  Ht 5\' 7"  (1.702 m)  Wt 295 lb (133.811 kg)  BMI 46.19 kg/m2  SpO2 95%  BP/Weight 02/02/2016 01/11/2016 99991111  Systolic BP 123XX123 Q000111Q A999333  Diastolic BP 82 80 84  Wt. (Lbs) 295 295 291  BMI 46.19 44.2 45.57    Physical Exam  Constitutional: She is oriented to person, place, and time. She appears well-developed and well-nourished. No distress.  Obese   HENT:  Head: Normocephalic and atraumatic.  Right Ear: Tympanic membrane, external ear and ear canal normal.  Left Ear: Tympanic membrane, external ear and ear canal normal.  Nose: No mucosal edema.  Mouth/Throat: Oropharynx is clear and moist and mucous membranes are normal.  Eyes: EOM are normal. Pupils are equal, round, and reactive to light.    Cardiovascular: Normal rate, regular rhythm, normal heart sounds and intact distal pulses.   Pulmonary/Chest: Effort normal and breath sounds normal. No respiratory distress. She has no rales. She exhibits no tenderness.  Musculoskeletal: She exhibits no edema.       Back:  Lymphadenopathy:    She has no  cervical adenopathy.  Neurological: She is alert and oriented to person, place, and time.  Skin: Skin is warm and dry. No rash noted.  Psychiatric: She has a normal mood and affect.     Assessment & Plan:   There are no diagnoses linked to this encounter. Jasmine Martin was seen today for cough and neck pain.  Diagnoses and all orders for this visit:  Right knee pain -     ibuprofen (ADVIL,MOTRIN) 600 MG tablet; Take 1 tablet (600 mg total) by mouth every 8 (eight) hours as needed.  Cough -      cetirizine (ZYRTEC) 10 MG tablet; Take 1 tablet (10 mg total) by mouth daily.  Gastroesophageal reflux disease, esophagitis presence not specified -     pantoprazole (PROTONIX) 40 MG tablet; Take 1 tablet (40 mg total) by mouth daily.  Upper back pain on right side -     cyclobenzaprine (FLEXERIL) 10 MG tablet; Take 1 tablet (10 mg total) by mouth 3 (three) times daily as needed for muscle spasms.   Meds ordered this encounter  Medications  . ibuprofen (ADVIL,MOTRIN) 600 MG tablet    Sig: Take 1 tablet (600 mg total) by mouth every 8 (eight) hours as needed.    Dispense:  30 tablet    Refill:  0  . cetirizine (ZYRTEC) 10 MG tablet    Sig: Take 1 tablet (10 mg total) by mouth daily.    Dispense:  30 tablet    Refill:  5  . pantoprazole (PROTONIX) 40 MG tablet    Sig: Take 1 tablet (40 mg total) by mouth daily.    Dispense:  30 tablet    Refill:  5  . cyclobenzaprine (FLEXERIL) 10 MG tablet    Sig: Take 1 tablet (10 mg total) by mouth 3 (three) times daily as needed for muscle spasms.    Dispense:  15 tablet    Refill:  0    Follow-up: No Follow-up on file.   Boykin Nearing MD

## 2016-02-02 NOTE — Assessment & Plan Note (Signed)
Resolved

## 2016-03-29 ENCOUNTER — Ambulatory Visit: Payer: Self-pay | Attending: Family Medicine | Admitting: Family Medicine

## 2016-03-29 ENCOUNTER — Encounter: Payer: Self-pay | Admitting: Family Medicine

## 2016-03-29 ENCOUNTER — Other Ambulatory Visit: Payer: Self-pay

## 2016-03-29 VITALS — BP 142/79 | HR 76 | Temp 98.5°F | Resp 16 | Ht 67.0 in | Wt 291.6 lb

## 2016-03-29 DIAGNOSIS — R079 Chest pain, unspecified: Secondary | ICD-10-CM | POA: Insufficient documentation

## 2016-03-29 DIAGNOSIS — R42 Dizziness and giddiness: Secondary | ICD-10-CM | POA: Insufficient documentation

## 2016-03-29 DIAGNOSIS — R030 Elevated blood-pressure reading, without diagnosis of hypertension: Secondary | ICD-10-CM

## 2016-03-29 DIAGNOSIS — R5383 Other fatigue: Secondary | ICD-10-CM | POA: Insufficient documentation

## 2016-03-29 DIAGNOSIS — Z1159 Encounter for screening for other viral diseases: Secondary | ICD-10-CM

## 2016-03-29 DIAGNOSIS — Z6841 Body Mass Index (BMI) 40.0 and over, adult: Secondary | ICD-10-CM | POA: Insufficient documentation

## 2016-03-29 DIAGNOSIS — IMO0001 Reserved for inherently not codable concepts without codable children: Secondary | ICD-10-CM

## 2016-03-29 DIAGNOSIS — I10 Essential (primary) hypertension: Secondary | ICD-10-CM | POA: Insufficient documentation

## 2016-03-29 DIAGNOSIS — R0602 Shortness of breath: Secondary | ICD-10-CM | POA: Insufficient documentation

## 2016-03-29 DIAGNOSIS — F419 Anxiety disorder, unspecified: Secondary | ICD-10-CM | POA: Insufficient documentation

## 2016-03-29 DIAGNOSIS — Z791 Long term (current) use of non-steroidal anti-inflammatories (NSAID): Secondary | ICD-10-CM | POA: Insufficient documentation

## 2016-03-29 DIAGNOSIS — K222 Esophageal obstruction: Secondary | ICD-10-CM | POA: Insufficient documentation

## 2016-03-29 DIAGNOSIS — Z8709 Personal history of other diseases of the respiratory system: Secondary | ICD-10-CM

## 2016-03-29 DIAGNOSIS — J45909 Unspecified asthma, uncomplicated: Secondary | ICD-10-CM | POA: Insufficient documentation

## 2016-03-29 DIAGNOSIS — K200 Eosinophilic esophagitis: Secondary | ICD-10-CM | POA: Insufficient documentation

## 2016-03-29 DIAGNOSIS — Z79899 Other long term (current) drug therapy: Secondary | ICD-10-CM | POA: Insufficient documentation

## 2016-03-29 DIAGNOSIS — Z114 Encounter for screening for human immunodeficiency virus [HIV]: Secondary | ICD-10-CM | POA: Insufficient documentation

## 2016-03-29 LAB — CBC
HCT: 47.6 % — ABNORMAL HIGH (ref 35.0–45.0)
HEMOGLOBIN: 16.1 g/dL — AB (ref 11.7–15.5)
MCH: 30 pg (ref 27.0–33.0)
MCHC: 33.8 g/dL (ref 32.0–36.0)
MCV: 88.6 fL (ref 80.0–100.0)
MPV: 12.3 fL (ref 7.5–12.5)
Platelets: 251 10*3/uL (ref 140–400)
RBC: 5.37 MIL/uL — ABNORMAL HIGH (ref 3.80–5.10)
RDW: 13.9 % (ref 11.0–15.0)
WBC: 6.4 10*3/uL (ref 3.8–10.8)

## 2016-03-29 LAB — HEMOGLOBIN A1C
Hgb A1c MFr Bld: 6 % — ABNORMAL HIGH (ref <5.7)
Mean Plasma Glucose: 126 mg/dL

## 2016-03-29 MED ORDER — ALBUTEROL SULFATE HFA 108 (90 BASE) MCG/ACT IN AERS: 2.0000 | INHALATION_SPRAY | Freq: Four times a day (QID) | RESPIRATORY_TRACT | Status: DC | PRN

## 2016-03-29 MED ORDER — FLUTICASONE PROPIONATE HFA 110 MCG/ACT IN AERO: INHALATION_SPRAY | RESPIRATORY_TRACT | Status: DC

## 2016-03-29 MED FILL — !FLOVENT HFA 110 MCG INHALE: 110 | 30 days supply | Qty: 12 | Fill #0

## 2016-03-29 MED FILL — BENZONATATE 100 MG CAPSULE: 100 | 10 days supply | Qty: 20 | Fill #0

## 2016-03-29 MED FILL — VENTOLIN HFA 90 MCG INHALER: 108 (90 BAS | 25 days supply | Qty: 18 | Fill #0

## 2016-03-29 NOTE — Assessment & Plan Note (Signed)
A; chest pain and SOB in morbidly obese woman with hx of asthma and eosinophilic esophagitis. Normal EKG. HTN on today's exam. P: Refilled albuterol Adding flovent for eosinophilic esophagitis  Pulmonoogy referral for PFTs Sleep study to evaluate for OSA Labs- CBC, CMP, lipids, BNP She signed a release so that I can obtain and review ED notes from [facility]

## 2016-03-29 NOTE — Progress Notes (Signed)
HFU SHOB dizziness syncope Pain scale #9 shoulder and back pain  No tobacco user  No suicidal thoughts in the past two weeks

## 2016-03-29 NOTE — Patient Instructions (Addendum)
Jilda was seen today for hospitalization follow-up.  Diagnoses and all orders for this visit:  Eosinophilic esophagitis -     fluticasone (FLOVENT HFA) 110 MCG/ACT inhaler; 2 puffs, hold in mouth and swallow  Chest pain, unspecified chest pain type -     EKG 12-Lead -     Lipid Panel -     CBC -     COMPLETE METABOLIC PANEL WITH GFR -     Brain natriuretic peptide -     Split night study; Future -     Hemoglobin A1c  Morbid obesity, unspecified obesity type ([place]) -     Split night study; Future  Screening for HIV (human immunodeficiency virus) -     HIV antibody (with reflex)  Need for hepatitis C screening test -     Hepatitis C antibody, reflex  Elevated BP  History of asthma -     albuterol (PROVENTIL HFA;VENTOLIN HFA) 108 (90 Base) MCG/ACT inhaler; Inhale 2 puffs into the lungs every 6 (six) hours as needed for wheezing or shortness of breath. -     Ambulatory referral to Pulmonology  SOB (shortness of breath) -     albuterol (PROVENTIL HFA;VENTOLIN HFA) 108 (90 Base) MCG/ACT inhaler; Inhale 2 puffs into the lungs every 6 (six) hours as needed for wheezing or shortness of breath. -     Ambulatory referral to Pulmonology   EKG normal today  I will obtain and review your records from [facility] ED.  Start flovent for eosinophilic esophagitis, you will hold the medicine in your mouth and swallow it so it gets to your esophagus   I have referred you for a sleep study to rule out sleep apnea and to the pulmonologist for pulmonary (lung) function test  F/u in 4 weeks for BP check with plan to treat if still hypertensive   Dr. Adrian Blackwater

## 2016-03-29 NOTE — Progress Notes (Signed)
Subjective:  Patient ID: Jasmine Martin, female    DOB: [birth year]  Age: 55 y.o. MRN: [id number]  CC: Hospitalization Follow-up   HPI Jasmine Martin has hx of esophageal stricture and eosinophilic esophagitis she presents for    1. ED follow up: patient was treated in the [facility] ED on 03/23/16 for SOB and lightheadedness. She reports that labs and EKG were done in hospital, normal. She was treated with IV fluid and 1 breathing treatment.  She was diagnosed with asthma and anxiety. She has been feeling weak since then. She reports dizziness today. She is a chronic non smoker. She is obese. She had a viral respiratory illness in 01/2016, CXR done at that time revealed chronic elevation of R hemidiaphragm but was otherwise normal.  She has hx of similar symptoms at [facility] where she reports feeling overheated. She admits to fatigue and daytime somnolence. She sleeps on 2 pillows. She is unable to sleep on one.   Social History  Substance Use Topics  . Smoking status: Never Smoker   . Smokeless tobacco: Never Used  . Alcohol Use: No    Outpatient Prescriptions Prior to Visit  Medication Sig Dispense Refill  . albuterol (PROVENTIL HFA;VENTOLIN HFA) 108 (90 Base) MCG/ACT inhaler Inhale 2 puffs into the lungs every 6 (six) hours as needed for wheezing or shortness of breath. 54 g 3  . cetirizine (ZYRTEC) 10 MG tablet Take 1 tablet (10 mg total) by mouth daily. 30 tablet 5  . cyclobenzaprine (FLEXERIL) 10 MG tablet Take 1 tablet (10 mg total) by mouth 3 (three) times daily as needed for muscle spasms. 15 tablet 0  . ibuprofen (ADVIL,MOTRIN) 600 MG tablet Take 1 tablet (600 mg total) by mouth every 8 (eight) hours as needed. 30 tablet 0  . Multiple Vitamins-Minerals (MULTIVITAMIN WITH MINERALS) tablet Take 1 tablet by mouth daily. Reported on 01/01/2016    . pantoprazole (PROTONIX) 40 MG tablet Take 1 tablet (40 mg total) by mouth daily. 30 tablet 5   No facility-administered  medications prior to visit.    ROS Review of Systems  Constitutional: Positive for fatigue. Negative for fever and chills.  Eyes: Negative for visual disturbance.  Respiratory: Positive for cough and shortness of breath.   Cardiovascular: Positive for chest pain.  Gastrointestinal: Negative for abdominal pain and blood in stool.  Musculoskeletal: Negative for back pain and arthralgias.  Skin: Negative for rash.  Allergic/Immunologic: Negative for immunocompromised state.  Neurological: Positive for dizziness.  Hematological: Negative for adenopathy. Does not bruise/bleed easily.  Psychiatric/Behavioral: Positive for sleep disturbance. Negative for suicidal ideas and dysphoric mood.   Lab Results  Component Value Date   HGBA1C 5.9* 12/02/2014    Objective:  BP 142/79 mmHg  Pulse 76  Temp(Src) 98.5 F (36.9 C) (Oral)  Resp 16  Ht 5\' 7"  (1.702 m)  Wt 291 lb 9.6 oz (132.269 kg)  BMI 45.66 kg/m2  SpO2 94% on RA   BP/Weight 03/29/2016 [id number]  Systolic BP A999333 [id number]  Diastolic BP 79 82 80  Wt. (Lbs) 291.6 295 295  BMI 45.66 46.19 44.2   Wt Readings from Last 3 Encounters:  02/02/16 295 lb (133.811 kg)  01/11/16 295 lb (133.811 kg)  01/01/16 291 lb (131.997 kg)    Orthostatic VS for the past 24 hrs:  BP- Lying Pulse- Lying BP- Sitting Pulse- Sitting BP- Standing at 0 minutes Pulse- Standing at 0 minutes  03/29/16 1028 147/87 mmHg 71 141/90 mmHg 73 (!)  153/104 mmHg 80    Physical Exam  Constitutional: She is oriented to person, place, and time. She appears well-developed and well-nourished. No distress.  Morbidly obese   HENT:  Head: Normocephalic and atraumatic.  Cardiovascular: Normal rate, regular rhythm, normal heart sounds and intact distal pulses.   Pulmonary/Chest: Effort normal and breath sounds normal.  Musculoskeletal: She exhibits no edema.  Neurological: She is alert and oriented to person, place, and time.  Skin: Skin is warm and dry. No rash  noted.  Psychiatric: She has a normal mood and affect.   EKG: normal EKG, normal sinus rhythm.  Assessment & Plan:   There are no diagnoses linked to this encounter.  Jasmine Martin was seen today for hospitalization follow-up.  Diagnoses and all orders for this visit:  Eosinophilic esophagitis -     Discontinue: fluticasone (FLOVENT HFA) 110 MCG/ACT inhaler; 2 puffs, hold in mouth and swallow -     fluticasone (FLOVENT HFA) 110 MCG/ACT inhaler; 2 puffs once daily, hold in mouth and swallow  Chest pain, unspecified chest pain type -     EKG 12-Lead -     Lipid Panel -     CBC -     COMPLETE METABOLIC PANEL WITH GFR -     Brain natriuretic peptide -     Split night study; Future -     Hemoglobin A1c  Morbid obesity, unspecified obesity type ([place]) -     Split night study; Future  Screening for HIV (human immunodeficiency virus) -     HIV antibody (with reflex)  Need for hepatitis C screening test -     Hepatitis C antibody, reflex  Elevated BP  History of asthma -     albuterol (PROVENTIL HFA;VENTOLIN HFA) 108 (90 Base) MCG/ACT inhaler; Inhale 2 puffs into the lungs every 6 (six) hours as needed for wheezing or shortness of breath. -     Ambulatory referral to Pulmonology  SOB (shortness of breath) -     albuterol (PROVENTIL HFA;VENTOLIN HFA) 108 (90 Base) MCG/ACT inhaler; Inhale 2 puffs into the lungs every 6 (six) hours as needed for wheezing or shortness of breath. -     Ambulatory referral to Pulmonology   No orders of the defined types were placed in this encounter.    Follow-up: No Follow-up on file.   Boykin Nearing MD

## 2016-03-29 NOTE — Assessment & Plan Note (Signed)
Elevated BP today Close f/u with plan to treat of persistently elevated

## 2016-03-30 LAB — COMPLETE METABOLIC PANEL WITH GFR
ALK PHOS: 53 U/L (ref 33–130)
ALT: 49 U/L — ABNORMAL HIGH (ref 6–29)
AST: 35 U/L (ref 10–35)
Albumin: 4.3 g/dL (ref 3.6–5.1)
BUN: 9 mg/dL (ref 7–25)
CO2: 27 mmol/L (ref 20–31)
Calcium: 10.3 mg/dL (ref 8.6–10.4)
Chloride: 100 mmol/L (ref 98–110)
Creat: 0.66 mg/dL (ref 0.50–1.05)
GFR, Est African American: >89 mL/min (ref >=60)
GFR, Est Non African American: >89 mL/min (ref >=60)
GLUCOSE: 104 mg/dL — AB (ref 65–99)
Potassium: 4.1 mmol/L (ref 3.5–5.3)
SODIUM: 140 mmol/L (ref 135–146)
Total Bilirubin: 0.5 mg/dL (ref 0.2–1.2)
Total Protein: 7.3 g/dL (ref 6.1–8.1)

## 2016-03-30 LAB — HIV ANTIBODY (ROUTINE TESTING W REFLEX): HIV: NONREACTIVE

## 2016-03-30 LAB — HEPATITIS C ANTIBODY: HCV AB: NEGATIVE

## 2016-03-30 LAB — LIPID PANEL
Cholesterol: 155 mg/dL (ref 125–200)
HDL: 49 mg/dL (ref >=46)
LDL CALC: 76 mg/dL (ref <130)
Total CHOL/HDL Ratio: 3.2 Ratio (ref <=5.0)
Triglycerides: 151 mg/dL — ABNORMAL HIGH (ref <150)
VLDL: 30 mg/dL (ref <30)

## 2016-03-30 LAB — BRAIN NATRIURETIC PEPTIDE: Brain Natriuretic Peptide: <4 pg/mL (ref <100)

## 2016-04-14 ENCOUNTER — Telehealth: Payer: Self-pay | Admitting: *Deleted

## 2016-04-14 NOTE — Telephone Encounter (Signed)
LVM to return call.

## 2016-04-14 NOTE — Telephone Encounter (Signed)
-----   Message from Boykin Nearing, MD sent at 03/30/2016 11:36 AM EDT ----- All labs normal except elevated TGs, blood sugar and A1c consistent with pre-diabetes, cut back on sugar, increase exercise, work on weight loss to prevent diabetes. Also Hgb/Hct slightly which occurs in chronically low O2. pulm evaluation and sleep study planned.

## 2016-05-05 ENCOUNTER — Institutional Professional Consult (permissible substitution): Payer: Self-pay | Admitting: Internal Medicine

## 2016-05-11 NOTE — Telephone Encounter (Signed)
Telephone call to patient, patient information verified. Rn advised patient:All labs normal except elevated TGs, blood sugar and A1c consistent with pre-diabetes, cut back on sugar, increase exercise, work on weight loss to prevent diabetes. Also Hgb/Hct slightly which occurs in chronically low O2. pulm evaluation and sleep study planned. Patient verbalized understanding. Priscille Heidelberg, RN, BSN

## 2016-05-12 ENCOUNTER — Institutional Professional Consult (permissible substitution): Payer: Self-pay | Admitting: Internal Medicine

## 2016-05-13 ENCOUNTER — Other Ambulatory Visit: Payer: Self-pay | Admitting: Family Medicine

## 2016-05-13 ENCOUNTER — Encounter: Payer: Self-pay | Admitting: Internal Medicine

## 2016-05-13 ENCOUNTER — Ambulatory Visit (INDEPENDENT_AMBULATORY_CARE_PROVIDER_SITE_OTHER): Payer: Self-pay | Admitting: Internal Medicine

## 2016-05-13 VITALS — BP 126/74 | HR 82 | Ht 67.5 in | Wt 291.0 lb

## 2016-05-13 DIAGNOSIS — R05 Cough: Secondary | ICD-10-CM

## 2016-05-13 DIAGNOSIS — R058 Other specified cough: Secondary | ICD-10-CM

## 2016-05-13 MED ORDER — PREDNISONE 10 MG PO TABS: ORAL_TABLET | ORAL | Status: DC

## 2016-05-13 MED ORDER — FAMOTIDINE 20 MG PO TABS: ORAL_TABLET | ORAL | Status: DC

## 2016-05-13 MED FILL — predniSONE 10 MG TABS: 10 | 6 days supply | Qty: 14 | Fill #0

## 2016-05-13 MED FILL — ?PANTOPRAZOLE SOD DR 40MG: 40 MG | 30 days supply | Qty: 30 | Fill #1

## 2016-05-13 MED FILL — ?CETIRIZINE HCL 10 MG TABLE: 10 | 30 days supply | Qty: 30 | Fill #1

## 2016-05-13 MED FILL — VENTOLIN HFA 90 MCG INHALER: 108 (90 BAS | 25 days supply | Qty: 18 | Fill #1

## 2016-05-13 NOTE — Patient Instructions (Signed)
Stop flovent (fluticasone)   Prednisone 10 mg take  4 each am x 2 days,   2 each am x 2 days,  1 each am x 2 days and stop   Only use your albuterol as a rescue medication to be used if you can't catch your breath by resting or doing a relaxed purse lip breathing pattern.  - The less you use it, the better it will work when you need it. - Ok to use up to 2 puffs  every 4 hours if you must but call for immediate appointment if use goes up over your usual need - Don't leave home without it !!  (think of it like the spare tire for your car)   Pantoprazole (protonix) 40 mg   Take  30-60 min before first meal of the day and Pepcid (famotidine)  20 mg one @  bedtime until return to office - this is the best way to tell whether stomach acid is contributing to your problem.     GERD (REFLUX)  is an extremely common cause of respiratory symptoms just like yours , many times with no obvious heartburn at all.    It can be treated with medication, but also with lifestyle changes including elevation of the head of your bed (ideally with 6 inch  bed blocks),  Smoking cessation, avoidance of late meals, excessive alcohol, and avoid fatty foods, chocolate, peppermint, colas, red wine, and acidic juices such as orange juice.  NO MINT OR MENTHOL PRODUCTS SO NO COUGH DROPS  USE SUGARLESS CANDY INSTEAD (Jolley ranchers or Stover's or Life Savers) or even ice chips will also do - the key is to swallow to prevent all throat clearing. NO OIL BASED VITAMINS - use powdered substitutes.    Please schedule a follow up office visit in 6 weeks, call sooner if needed

## 2016-05-13 NOTE — Assessment & Plan Note (Signed)
Body mass index is 44.88   Lab Results  Component Value Date   TSH 3.113 12/02/2014     Contributing to gerd tendency/ doe/reviewed the need and the process to achieve and maintain neg calorie balance > defer f/u primary care including intermittently monitoring thyroid status

## 2016-05-13 NOTE — Assessment & Plan Note (Addendum)
This is almost certainly not asthma but rather   Upper airway cough syndrome, so named because it's frequently impossible to sort out how much is  CR/sinusitis with freq throat clearing (which can be related to primary GERD)   vs  causing  secondary (" extra esophageal")  GERD from wide swings in gastric pressure that occur with throat clearing, often  promoting self use of mint and menthol lozenges that reduce the lower esophageal sphincter tone and exacerbate the problem further in a cyclical fashion.   These are the same pts (now being labeled as having "irritable larynx syndrome" by some cough centers) who not infrequently have a history of having failed to tolerate ace inhibitors,  dry powder inhalers or biphosphonates or report having atypical reflux symptoms that don't respond to standard doses of PPI , and are easily confused as having aecopd or asthma flares by even experienced allergists/ pulmonologists.  rec try off flovent, short course prednisone to cover in case she does have a true asthma component, and redouble effort to eliminate gerd.  Total time devoted to counseling  = 35/81m review case with pt/ discussion of options/alternatives/ personally creating written instructions  in presence of pt  then going over those specific  Instructions directly with the pt including how to use all of the meds but in particular covering each new medication in detail and the difference between the maintenance/automatic meds and the prns using an action plan format for the latter.

## 2016-05-13 NOTE — Progress Notes (Signed)
Subjective:    Patient ID: Jasmine Martin, female    DOB: 01/19/1961,    MRN: [id number]  HPI  57 yowf never smoker healthy as child with onset of sob late 59's assoc with wt gain from baseline wt of 34 and up to 5 with variable sob/heat intolerance since then referred to pulmonary clinic 05/13/2016 by Dr  Adrian Blackwater with ? Asthma not resp to saba/ flovent hfa    05/13/2016 1st  Pulmonary office visit/ Ralph Brouwer   Chief Complaint  Patient presents with  . Pulmonary Consult    Referred by Dr. Boykin Nearing. Pt c/o SOB x 2 yrs. She states that she gets SOB only when she goes outdoors for too long, or when she is exposed to sunlight or heat. She also c/o occ chest pressure and cough  sob indolent onset progressive x 2 years but really noted decrease ex tol/ heat intol x her late 40's first noted just with hot weather, assoc also with dry hacking cough and some better with saba  x 1-2 min only/ no better with flovert Takes protonix 4pm daily  Sleeps on 2-3 pillows but if tries to sleep flat immediate gagging   No obvious other patterns in day to day or daytime variabilty or assoc excess/ purulent sputum or mucus plugs  or cp or chest tightness, subjective wheeze overt sinus  symptoms. No unusual exp hx or h/o childhood pna/ asthma or knowledge of premature birth.  Sleeping ok without nocturnal  or early am exacerbation  of respiratory  c/o's or need for noct saba. Also denies any obvious fluctuation of symptoms with weather or environmental changes or other aggravating or alleviating factors except as outlined above   Current Medications, Allergies, Complete Past Medical History, Past Surgical History, Family History, and Social History were reviewed in [employer] record.             Review of Systems  Constitutional: Negative for fever, chills and unexpected weight change.  HENT: Positive for congestion. Negative for dental problem, ear pain, nosebleeds,  postnasal drip, rhinorrhea, sinus pressure, sneezing, sore throat, trouble swallowing and voice change.   Eyes: Negative for visual disturbance.  Respiratory: Positive for cough and shortness of breath. Negative for choking.   Cardiovascular: Negative for chest pain and leg swelling.  Gastrointestinal: Negative for vomiting, abdominal pain and diarrhea.       Acid heartburn Indigestion  Genitourinary: Negative for difficulty urinating.  Musculoskeletal: Negative for arthralgias.  Skin: Negative for rash.  Neurological: Negative for tremors, syncope and headaches.  Hematological: Does not bruise/bleed easily.       Objective:   Physical Exam  Massively obese wf nad  Wt Readings from Last 3 Encounters:  05/13/16 291 lb (131.997 kg)  03/29/16 291 lb 9.6 oz (132.269 kg)  02/02/16 295 lb (133.811 kg)    Vital signs reviewed    HEENT: nl dentition, turbinates, and oropharynx. Nl external ear canals without cough reflex   NECK :  without JVD/Nodes/TM/ nl carotid upstrokes bilaterally   LUNGS: no acc muscle use,  Nl contour chest  With harsh cough early on insp - no exp wheeze  CV:  RRR  no s3 or murmur or increase in P2, no edema   ABD:  soft and nontender with nl inspiratory excursion in the supine position. No bruits or organomegaly, bowel sounds nl  MS:  Nl gait/ ext warm without deformities, calf tenderness, cyanosis or clubbing No obvious joint restrictions  SKIN: warm and dry without lesions    NEURO:  alert, approp, nl sensorium with  no motor deficits      I personally reviewed images and agree with radiology impression as follows:  CXR:  03/23/16  Low lung volumes          Assessment & Plan:

## 2016-05-16 ENCOUNTER — Other Ambulatory Visit (HOSPITAL_COMMUNITY): Payer: Self-pay | Admitting: *Deleted

## 2016-05-16 ENCOUNTER — Other Ambulatory Visit: Payer: Self-pay | Admitting: Family Medicine

## 2016-05-16 DIAGNOSIS — N6310 Unspecified lump in the right breast, unspecified quadrant: Secondary | ICD-10-CM

## 2016-05-29 ENCOUNTER — Ambulatory Visit (HOSPITAL_BASED_OUTPATIENT_CLINIC_OR_DEPARTMENT_OTHER): Payer: Self-pay

## 2016-06-15 ENCOUNTER — Telehealth (HOSPITAL_COMMUNITY): Payer: Self-pay | Admitting: *Deleted

## 2016-06-15 NOTE — Telephone Encounter (Signed)
Telephoned patient at home # and confirmed [facility] appointment Thursday August 10 10:30

## 2016-06-16 ENCOUNTER — Encounter (HOSPITAL_COMMUNITY): Payer: Self-pay

## 2016-06-16 ENCOUNTER — Ambulatory Visit (HOSPITAL_COMMUNITY)
Admission: RE | Admit: 2016-06-16 | Discharge: 2016-06-16 | Disposition: A | Discharge status: Home / Self Care | Payer: Self-pay | Source: Ambulatory Visit | Attending: Obstetrics and Gynecology | Admitting: Obstetrics and Gynecology

## 2016-06-16 ENCOUNTER — Ambulatory Visit
Admission: RE | Admit: 2016-06-16 | Discharge: 2016-06-16 | Disposition: A | Discharge status: Home / Self Care | Payer: No Typology Code available for payment source | Source: Ambulatory Visit | Attending: Obstetrics and Gynecology | Admitting: Obstetrics and Gynecology

## 2016-06-16 VITALS — BP 124/82 | Temp 97.6°F | Ht 67.5 in | Wt 291.2 lb

## 2016-06-16 DIAGNOSIS — N6310 Unspecified lump in the right breast, unspecified quadrant: Secondary | ICD-10-CM

## 2016-06-16 DIAGNOSIS — Z1239 Encounter for other screening for malignant neoplasm of breast: Secondary | ICD-10-CM

## 2016-06-16 NOTE — Progress Notes (Signed)
Patient referred to [facility]CCCP due to six month follow up right breast diagnostic mammogram and ultrasound being recommended. Right diagnostic mammogram and ultrasound completed 03/02/2015.   Pap Smear:  Pap smear not completed today. Per patient she has never had a Pap smear. Patient has a history of a hysterectomy 19 years ago due to AUB. Due to patient's history of a hysterectomy for benign reasons no longer needs Pap smears due to BCCCP and ACOG guidelines. No Pap smear results are in EPIC.  Physical exam: Breasts Breasts symmetrical. No skin abnormalities bilateral breasts. No nipple retraction bilateral breasts. No nipple discharge bilateral breasts. No lymphadenopathy. No lumps palpated bilateral breasts. No complaints of pain or tenderness on exam. Referred patient to the [facility] for diagnostic mammogram and right breast ultrasound per recommendation. Appointment scheduled for Thursday, June 16, 2016 at 1110.        Pelvic/Bimanual No Pap smear completed today since patient has a history of a hysterectomy for benign reasons. Pap smear not indicated per BCCCP guidelines.   Smoking History: Patient has never smoked.  Patient Navigation: Patient education provided. Access to services provided for patient through [facility] program.   Colorectal Cancer Screening: Patient had a colonoscopy completed 01/15/2015 and follow up colonoscopy recommended in 5 years. No complaints today.

## 2016-06-16 NOTE — Addendum Note (Signed)
Encounter addended by: Loletta Parish, RN on: 06/16/2016 12:49 PM<BR>    Actions taken: Follow-up modified

## 2016-06-16 NOTE — Patient Instructions (Signed)
Explained breast self awareness to Jasmine Martin. Patient did not need a Pap smear today due to her history of a hysterectomy for benign reasons. Let patient know that she no longer needs Pap smears due to her history of a hysterectomy for benign reasons. Referred patient to the [facility] for diagnostic mammogram and right breast ultrasound per recommendation. Appointment scheduled for Thursday, June 16, 2016 at 1110. Jasmine Martin verbalized understanding.  Lilac Hoff, Arvil Chaco, RN 11:51 AM

## 2016-06-20 ENCOUNTER — Encounter (HOSPITAL_COMMUNITY): Payer: Self-pay | Admitting: *Deleted

## 2016-06-24 ENCOUNTER — Ambulatory Visit: Payer: Self-pay | Admitting: Internal Medicine

## 2016-07-05 ENCOUNTER — Ambulatory Visit (HOSPITAL_BASED_OUTPATIENT_CLINIC_OR_DEPARTMENT_OTHER): Payer: Self-pay | Attending: Family Medicine | Admitting: Internal Medicine

## 2016-07-05 DIAGNOSIS — M549 Dorsalgia, unspecified: Secondary | ICD-10-CM | POA: Insufficient documentation

## 2016-07-05 DIAGNOSIS — G4733 Obstructive sleep apnea (adult) (pediatric): Secondary | ICD-10-CM | POA: Insufficient documentation

## 2016-07-05 DIAGNOSIS — R0683 Snoring: Secondary | ICD-10-CM | POA: Insufficient documentation

## 2016-07-05 DIAGNOSIS — R5383 Other fatigue: Secondary | ICD-10-CM | POA: Insufficient documentation

## 2016-07-05 DIAGNOSIS — G4736 Sleep related hypoventilation in conditions classified elsewhere: Secondary | ICD-10-CM | POA: Insufficient documentation

## 2016-07-05 DIAGNOSIS — G4719 Other hypersomnia: Secondary | ICD-10-CM | POA: Insufficient documentation

## 2016-07-05 DIAGNOSIS — G4761 Periodic limb movement disorder: Secondary | ICD-10-CM | POA: Insufficient documentation

## 2016-07-05 DIAGNOSIS — Z6841 Body Mass Index (BMI) 40.0 and over, adult: Secondary | ICD-10-CM | POA: Insufficient documentation

## 2016-07-05 DIAGNOSIS — R079 Chest pain, unspecified: Secondary | ICD-10-CM | POA: Insufficient documentation

## 2016-07-06 ENCOUNTER — Telehealth: Payer: Self-pay | Admitting: Internal Medicine

## 2016-07-06 NOTE — Telephone Encounter (Signed)
Called spoke with pt. She is scheduled to see MW on 07/25/16.  She is wanting to see him tomorrow since the weather is cooler. Advised MW is not in the office and he is booked up on Friday. She reports she will just keep scheduled appt. Nothing further needed

## 2016-07-13 ENCOUNTER — Encounter: Payer: Self-pay | Admitting: Internal Medicine

## 2016-07-13 ENCOUNTER — Ambulatory Visit (INDEPENDENT_AMBULATORY_CARE_PROVIDER_SITE_OTHER): Payer: Self-pay | Admitting: Internal Medicine

## 2016-07-13 VITALS — BP 126/74 | HR 83 | Ht 67.5 in | Wt 286.0 lb

## 2016-07-13 DIAGNOSIS — R058 Other specified cough: Secondary | ICD-10-CM

## 2016-07-13 DIAGNOSIS — R05 Cough: Secondary | ICD-10-CM

## 2016-07-13 NOTE — Assessment & Plan Note (Signed)
Body mass index is 44.13 but trending down now Lab Results  Component Value Date   TSH 3.113 12/02/2014     Contributing to gerd tendency/ doe/reviewed the need and the process to achieve and maintain neg calorie balance > defer f/u primary care including intermittently monitoring thyroid status    Advised to consider water aerobics as back/ankles/ feet also limiting from walking

## 2016-07-13 NOTE — Assessment & Plan Note (Signed)
Trial off flovent and on max gerd diet/ meds 05/13/2016 >  Improved 07/13/2016   No evidence at all of asthma at present though certainly may flare with uris  However, Of the three most common causes of chronic cough, only one (GERD)  can actually cause the other two (asthma and post nasal drip syndrome)  and perpetuate the cylce of cough inducing airway trauma, inflammation, heightened sensitivity to reflux which is prompted by the cough itself via a cyclical mechanism.    This may partially respond to steroids and look like asthma and post nasal drainage but never erradicated completely unless the cough and the secondary reflux are eliminated, preferably both at the same time.  While not intuitively obvious, many patients with chronic low grade reflux do not cough until there is a secondary insult that disturbs the protective epithelial barrier and exposes sensitive nerve endings.  This can be a viral URI(as her hx suggests)  or direct physical injury such as with an endotracheal tube.   The point is that once this occurs, it is difficult to eliminate using anything but a maximally effective acid suppression regimen at least in the short run, accompanied by an appropriate diet to address non acid GERD.   gerd rx per Dr Loma Messing ent eval prn

## 2016-07-13 NOTE — Patient Instructions (Signed)
Only use your albuterol as a rescue medication to be used if you can't catch your breath by resting or doing a relaxed purse lip breathing pattern.  - The less you use it, the better it will work when you need it. - Ok to use up to 2 puffs  every 4 hours if you must but call for immediate appointment if use goes up over your usual need - Don't leave home without it !!  (think of it like the spare tire for your car)     If you are satisfied with your treatment plan,  let your doctor know and he/she can either refill your medications or you can return here when your prescription runs out.     If in any way you are not 100% satisfied,  please tell us.  If 100% better, tell your friends!  Pulmonary follow up is as needed   

## 2016-07-13 NOTE — Progress Notes (Signed)
Subjective:    Patient ID: Jasmine Martin, female    DOB: 05-21-1961    MRN: [id number]    Brief patient profile:  45 yowf never smoker healthy as child with onset of sob late 20's assoc with wt gain from baseline wt of 60 and up to 64 with variable sob/heat intolerance since then referred to pulmonary clinic 05/13/2016 by Dr  Adrian Blackwater with ? Asthma not resp to saba/ flovent hfa     History of Present Illness  05/13/2016 1st [facility] office visit/ Shayne Diguglielmo   Chief Complaint  Patient presents with  . Pulmonary Consult    Referred by Dr. Boykin Nearing. Pt c/o SOB x 2 yrs. She states that she gets SOB only when she goes outdoors for too long, or when she is exposed to sunlight or heat. She also c/o occ chest pressure and cough  sob indolent onset progressive x 2 years but really noted decrease ex tol/ heat intol x her late 40's first noted just with hot weather, assoc also with dry hacking cough and some better with saba  x 1-2 min only/ no better with flovemt Takes protonix 4pm daily  Sleeps on 2-3 pillows but if tries to sleep flat immediate gagging  rec Stop flovent (fluticasone)  Prednisone 10 mg take  4 each am x 2 days,   2 each am x 2 days,  1 each am x 2 days and stop  Only use your albuterol as a rescue medication Pantoprazole (protonix) 40 mg   Take  30-60 min before first meal of the day and Pepcid (famotidine)  20 mg one @  bedtime until return to office  GERD diet     07/13/2016  f/u ov/Brady Schiller re:  Dyspnea in setting of prob gerd/ MO Chief Complaint  Patient presents with  . Follow-up    Pt states that her breathing is doing better. No new co's today.   able to lie down with only slt elevation / no gagging or difficulty   Doe x 100 ft/ can't do HT aisle s stopping/ both feet ankles/back give out also  Constant ear ringing both ears x years   No obvious day to day or daytime variability or assoc excess/ purulent sputum or mucus plugs or hemoptysis or cp or chest  tightness, subjective wheeze or overt sinus or hb symptoms. No unusual exp hx or h/o childhood pna/ asthma or knowledge of premature birth.  Sleeping ok without nocturnal  or early am exacerbation  of respiratory  c/o's or need for noct saba. Also denies any obvious fluctuation of symptoms with weather or environmental changes or other aggravating or alleviating factors except as outlined above   Current Medications, Allergies, Complete Past Medical History, Past Surgical History, Family History, and Social History were reviewed in [employer] record.  ROS  The following are not active complaints unless bolded sore throat, dysphagia, dental problems, itching, sneezing,  nasal congestion or excess/ purulent secretions, ear ache,   fever, chills, sweats, unintended wt loss, classically pleuritic or exertional cp,  orthopnea pnd or leg swelling, presyncope, palpitations, abdominal pain, anorexia, nausea, vomiting, diarrhea  or change in bowel or bladder habits, change in stools or urine, dysuria,hematuria,  rash, arthralgias, visual complaints, headache, numbness, weakness or ataxia or problems with walking or coordination,  change in mood/affect or memory.              Objective:   Physical Exam  Massively obese wf nad  07/13/2016          286  05/13/16 291 lb (131.997 kg)  03/29/16 291 lb 9.6 oz (132.269 kg)  02/02/16 295 lb (133.811 kg)    Vital signs reviewed  - note sats 96% RA on arrival    HEENT: nl dentition, turbinates, and oropharynx. Nl external ear canals without cough reflex   NECK :  without JVD/Nodes/TM/ nl carotid upstrokes bilaterally   LUNGS: no acc muscle use,  Nl contour chest  With no longer any cough on insp / exp/ clear to A and P  CV:  RRR  no s3 or murmur or increase in P2, no edema   ABD:  soft and nontender with nl inspiratory excursion in the supine position. No bruits or organomegaly, bowel sounds nl  MS:  Nl gait/ ext warm without  deformities, calf tenderness, cyanosis or clubbing No obvious joint restrictions   SKIN: warm and dry without lesions    NEURO:  alert, approp, nl sensorium with  no motor deficits      I personally reviewed images and agree with radiology impression as follows:  CXR:  03/23/16  Low lung volumes          Assessment & Plan:

## 2016-07-14 ENCOUNTER — Ambulatory Visit: Payer: Self-pay | Admitting: Internal Medicine

## 2016-07-18 ENCOUNTER — Ambulatory Visit: Payer: Self-pay | Admitting: Family Medicine

## 2016-07-20 ENCOUNTER — Ambulatory Visit (HOSPITAL_COMMUNITY)
Admission: EM | Admit: 2016-07-20 | Discharge: 2016-07-20 | Disposition: A | Discharge status: Home / Self Care | Payer: Self-pay | Attending: Family Medicine | Admitting: Family Medicine

## 2016-07-20 ENCOUNTER — Encounter (HOSPITAL_COMMUNITY): Payer: Self-pay | Admitting: Emergency Medicine

## 2016-07-20 DIAGNOSIS — R040 Epistaxis: Secondary | ICD-10-CM

## 2016-07-20 NOTE — ED Provider Notes (Signed)
[place]    CSN: [id number] Arrival date & time: 07/20/16  1302  First Provider Contact:  First MD Initiated Contact with Patient 07/20/16 1419     History   Chief Complaint Chief Complaint  Patient presents with  . Epistaxis    HPI Jasmine Martin is a 55 y.o. female with a history of OSA and eosinophilic esophagitis presenting for nose bleed.   About 5 hours PTA she was brushing her teeth and noticed bright red blood from bilateral nares that flowed for about 2 minutes before spontaneously resolving. Estimates ~ 1 oz. She felt light headed at the sight of blood but none since. No dyspnea. No history of easy bleeding/bruising or epistaxis. Has had some increased congestion lately, but no medications tried. Feels completely well on presentation.  HPI  Past Medical History:  Diagnosis Date  . Asthma     Patient Active Problem List   Diagnosis Date Noted  . Upper airway cough syndrome 05/13/2016  . Eosinophilic esophagitis [id number]  . Chest pain 03/29/2016  . Elevated BP 03/29/2016  . History of asthma 03/29/2016  . SOB (shortness of breath) 03/29/2016  . Seasonal allergies 02/02/2016  . Upper back pain on right side 02/02/2016  . GERD (gastroesophageal reflux disease) 01/11/2016  . Severe obesity (BMI >= 40) ([place]) 01/11/2016  . Right knee pain 01/01/2016    Past Surgical History:  Procedure Laterality Date  . ABDOMINAL HYSTERECTOMY    . CHOLECYSTECTOMY      OB History    Gravida Para Term Preterm AB Living   0 0 0 0 0 0   SAB TAB Ectopic Multiple Live Births   0 0 0 0 0       Home Medications    Prior to Admission medications   Medication Sig Start Date End Date Taking? Authorizing Provider  albuterol (PROVENTIL HFA;VENTOLIN HFA) 108 (90 Base) MCG/ACT inhaler Inhale 2 puffs into the lungs every 6 (six) hours as needed for wheezing or shortness of breath. 03/29/16   Boykin Nearing, MD  cetirizine (ZYRTEC) 10 MG tablet Take 1 tablet (10 mg  total) by mouth daily. 02/02/16   Josalyn Funches, MD  cyclobenzaprine (FLEXERIL) 10 MG tablet TAKE 1 TABLET BY MOUTH 3 TIMES DAILY AS NEEDED FOR MUSCLE SPASMS. 05/17/16   Boykin Nearing, MD  famotidine (PEPCID) 20 MG tablet One at bedtime 05/13/16   Tanda Rockers, MD  ibuprofen (ADVIL,MOTRIN) 600 MG tablet TAKE 1 TABLET BY MOUTH EVERY 8 HOURS AS NEEDED. 05/17/16   Boykin Nearing, MD  Multiple Vitamins-Minerals (MULTIVITAMIN WITH MINERALS) tablet Take 1 tablet by mouth daily. Reported on 01/01/2016    Historical Provider, MD  pantoprazole (PROTONIX) 40 MG tablet Take 1 tablet (40 mg total) by mouth daily. 02/02/16   Boykin Nearing, MD    Family History Family History  Problem Relation Age of Onset  . Liver cancer Father   . Hypertension Maternal Aunt   . Colon polyps Mother   . Diabetes Maternal Grandfather   . Diabetes Maternal Aunt   . Esophageal cancer Paternal Grandfather   . Colon cancer Neg Hx   . Kidney disease Neg Hx     Social History Social History  Substance Use Topics  . Smoking status: Never Smoker  . Smokeless tobacco: Never Used  . Alcohol use No     Allergies   Latex   Review of Systems Review of Systems No fevers, chills, weight loss, night sweats, chest pain, dyspnea, palpitations, N/V/D,  blood in stool.   Physical Exam Triage Vital Signs ED Triage Vitals [07/20/16 1414]  Enc Vitals Group     BP 140/74     Pulse Rate 79     Resp 18     Temp 99 F (37.2 C)     Temp Source Oral     SpO2 95 %     Weight 276 lb (125.2 kg)     Height 5\' 8"  (1.727 m)     Head Circumference      Peak Flow      Pain Score      Pain Loc      Pain Edu?      Excl. in [place]?    No data found.   Updated Vital Signs BP 140/74 (BP Location: Left Arm)   Pulse 79   Temp 99 F (37.2 C) (Oral)   Resp 18   Ht 5\' 8"  (1.727 m)   Wt 276 lb (125.2 kg)   SpO2 95%   BMI 41.97 kg/m   Visual Acuity Right Eye Distance:   Left Eye Distance:   Bilateral Distance:    Right  Eye Near:   Left Eye Near:    Bilateral Near:     Physical Exam  Constitutional: She is oriented to person, place, and time. She appears well-developed and well-nourished. No distress.  HENT:  Right Ear: External ear normal.  Left Ear: External ear normal.  Nose: Nose normal.  Mouth/Throat: Oropharynx is clear and moist.  no septal deviation or hematoma. Turbinates normal. No source of bleeding seen. Dried blood in left nare.   Eyes: EOM are normal. Pupils are equal, round, and reactive to light. No scleral icterus.  Neck: Neck supple. No JVD present.  Cardiovascular: Normal rate, regular rhythm, normal heart sounds and intact distal pulses.   No murmur heard. Pulmonary/Chest: Effort normal and breath sounds normal. No respiratory distress.  Abdominal: Soft. Bowel sounds are normal. She exhibits no distension. There is no tenderness.  Musculoskeletal: Normal range of motion. She exhibits no edema or tenderness.  Lymphadenopathy:    She has no cervical adenopathy.  Neurological: She is alert and oriented to person, place, and time. She exhibits normal muscle tone.  Skin: Skin is warm and dry. Capillary refill takes less than 2 seconds. No rash noted. No pallor.  no petechiae or ecchymoses  Vitals reviewed.  UC Treatments / Results  Labs (all labs ordered are listed, but only abnormal results are displayed) Labs Reviewed - No data to display  EKG  EKG Interpretation None       Radiology No results found.  Procedures Procedures (including critical care time)  Medications Ordered in UC Medications - No data to display   Initial Impression / Assessment and Plan / UC Course  I have reviewed the triage vital signs and the nursing notes.  Pertinent labs & imaging results that were available during my care of the patient were reviewed by me and considered in my medical decision making (see chart for details).  Final Clinical Impressions(s) / UC Diagnoses   Final  diagnoses:  Epistaxis   55 y.o. female presenting after ~ 2 minutes of self-limited low volume epistaxis without signs/symptoms of anemia or bleeding disorder. No indications for blood work or ENT referral at this time. Advised to return for recurrent bleeding, especially if > 10 minutes or symptomatic anemia.   New Prescriptions Discharge Medication List as of 07/20/2016  2:43 PM  Patrecia Pour, MD 07/20/16 1520

## 2016-07-20 NOTE — ED Triage Notes (Signed)
Reports nose bleed that started today.  No bleeding currently

## 2016-07-21 ENCOUNTER — Ambulatory Visit (HOSPITAL_BASED_OUTPATIENT_CLINIC_OR_DEPARTMENT_OTHER): Payer: Self-pay

## 2016-07-23 DIAGNOSIS — G4733 Obstructive sleep apnea (adult) (pediatric): Secondary | ICD-10-CM

## 2016-07-23 NOTE — Procedures (Signed)
  Patient Name: Jasmine Martin, [place] Date: 07/05/2016 Gender: Female D.O.B: 11/20/1960 Age (years): 74 Referring Provider: Adriana Mccallum Funches Height (inches): 67 Interpreting Physician: Baird Lyons MD, ABSM Weight (lbs): 281 RPSGT: Jonna Coup BMI: 44 MRN: [id number] Neck Size: 18.00 CLINICAL INFORMATION Sleep Study Type: NPSG    Indication for sleep study: Excessive Daytime Sleepiness, Fatigue, Obesity, Snoring   Epworth Sleepiness Score: 18/ 24   SLEEP STUDY TECHNIQUE As per the AASM Manual for the Scoring of Sleep and Associated Events v2.3 (April 2016) with a hypopnea requiring 4% desaturations. The channels recorded and monitored were frontal, central and occipital EEG, electrooculogram (EOG), submentalis EMG (chin), nasal and oral airflow, thoracic and abdominal wall motion, anterior tibialis EMG, snore microphone, electrocardiogram, and pulse oximetry.  MEDICATIONS Patient's medications include: charted for review. Medications self-administered by patient during sleep study : No sleep medicine administered.  SLEEP ARCHITECTURE The study was initiated at 10:49:33 PM and ended at 4:49:14 AM. Sleep onset time was 7.1 minutes and the sleep efficiency was 60.5%. The total sleep time was 217.5 minutes. Stage REM latency was 178.0 minutes. The patient spent 2.53% of the night in stage N1 sleep, 92.87% in stage N2 sleep, 0.00% in stage N3 and 4.60% in REM. Alpha intrusion was absent. Supine sleep was 0.23%. Wake after sleep onset 135 minutes  RESPIRATORY PARAMETERS The overall apnea/hypopnea index (AHI) was 3.0 per hour. There were 1 total apneas, including 1 obstructive, 0 central and 0 mixed apneas. There were 10 hypopneas and 1 RERAs. The AHI during Stage REM sleep was 42.0 per hour. AHI while supine was 0.0 per hour. The mean oxygen saturation was 91.63%. The minimum SpO2 during sleep was 66.00%. Moderate snoring was noted during this study.  CARDIAC DATA The  2 lead EKG demonstrated sinus rhythm. The mean heart rate was 80.11 beats per minute. Other EKG findings include: None.  LEG MOVEMENT DATA The total PLMS were 530 with a resulting PLMS index of 146.21. Associated arousal with leg movement index was 1.7 .  IMPRESSIONS - No significant obstructive sleep apnea occurred during this study (AHI = 3.0/h). - No significant central sleep apnea occurred during this study (CAI = 0.0/h). - Severe oxygen desaturation was noted during this study (Min O2 = 66.00%, Mean saturation 91%). - The patient snored with Moderate snoring volume. - No cardiac abnormalities were noted during this study. - Severe periodic limb movements of sleep occurred during the study. No significant associated arousals. - Patient slept with wedge due to back pain, and requested fan.  DIAGNOSIS - Nocturnal Hypoxemia (327.26 [G47.36 ICD-10]) - Periodic Limb Movement Syndrome (327.51 [G47.61 ICD-10])  RECOMMENDATIONS - Manage for Periodic Limb Movement sleep disorder - Reassess oxygenation during sleep with overnight oximetry if appropriate - Avoid alcohol, sedatives and other CNS depressants that may worsen sleep apnea and disrupt normal sleep architecture. - Sleep hygiene should be reviewed to assess factors that may improve sleep quality. - Weight management and regular exercise should be initiated or continued if appropriate.  [Electronically signed] 07/23/2016 10:27 AM  Baird Lyons MD, ABSM Diplomate, American Board of Sleep Medicine   NPI: [id number] [place], American Board of Sleep Medicine  ELECTRONICALLY SIGNED ON:  07/23/2016, 10:22 AM [place] PH: [phone]   FX: [phone] [place]

## 2016-07-25 ENCOUNTER — Ambulatory Visit: Payer: Self-pay | Admitting: Internal Medicine

## 2017-02-06 ENCOUNTER — Telehealth: Payer: Self-pay | Admitting: Family Medicine

## 2017-02-06 DIAGNOSIS — Z0000 Encounter for general adult medical examination without abnormal findings: Secondary | ICD-10-CM

## 2017-02-06 NOTE — Telephone Encounter (Signed)
Pt call  requesting an optometry referral  Thank you

## 2017-02-06 NOTE — Telephone Encounter (Signed)
Will route to PCP 

## 2017-02-07 NOTE — Telephone Encounter (Signed)
Referral placed.

## 2017-04-11 ENCOUNTER — Ambulatory Visit: Payer: Self-pay | Attending: Internal Medicine

## 2017-04-14 ENCOUNTER — Ambulatory Visit: Payer: Self-pay | Attending: Family Medicine | Admitting: Physician Assistant

## 2017-04-14 VITALS — BP 132/82 | HR 88 | Temp 97.2°F | Resp 18 | Ht 67.5 in | Wt 276.0 lb

## 2017-04-14 DIAGNOSIS — J45909 Unspecified asthma, uncomplicated: Secondary | ICD-10-CM | POA: Insufficient documentation

## 2017-04-14 DIAGNOSIS — Z79899 Other long term (current) drug therapy: Secondary | ICD-10-CM | POA: Insufficient documentation

## 2017-04-14 DIAGNOSIS — R202 Paresthesia of skin: Secondary | ICD-10-CM | POA: Insufficient documentation

## 2017-04-14 DIAGNOSIS — R739 Hyperglycemia, unspecified: Secondary | ICD-10-CM | POA: Insufficient documentation

## 2017-04-14 LAB — POCT GLYCOSYLATED HEMOGLOBIN (HGB A1C): HEMOGLOBIN A1C: 5.6

## 2017-04-14 LAB — GLUCOSE, POCT (MANUAL RESULT ENTRY): POC Glucose: 96 mg/dl (ref 70–99)

## 2017-04-14 NOTE — Progress Notes (Signed)
Jasmine Martin, is a 56 y.o. female  [id number]  [id number]  DOB - 1961-10-10  Subjective:  Chief Complaint and HPI: Jasmine Martin is a 56 y.o. female here today C/o B leg paresthesias and lower legs going to sleep.  A few weeks ago, she was sitting on the toilet and B of her legs fell asleep.  She had to call her mom into the bathroom to help her "shake her legs awake."  This has been occurring for about 1 month.  She has been using an elliptical machine for exercise about 5-10 mins a day.  She is trying to lose weight.  She also relates to me a fall she had a couple of weeks ago and she is still having some soreness.  She missed a bottom step when she was carrying some things and fell back onto her R side. No LOC. R shoulder and R hip sore and has bruising that is starting to go away.  Social:  Lives with her mom  ROS:   Constitutional:  No f/c, No night sweats, No unexplained weight loss. EENT:  No vision changes, No blurry vision, No hearing changes. No mouth, throat, or ear problems.  Respiratory: No cough, No SOB Cardiac: No CP, no palpitations GI:  No abd pain, No N/V/D. GU: No Urinary s/sx Musculoskeletal: see above Neuro: No headache, no dizziness, no motor weakness.  Skin: No rash Endocrine:  No polydipsia. No polyuria.  Psych: Denies SI/HI  No problems updated.  ALLERGIES: Allergies  Allergen Reactions  . Latex Itching and Rash    PAST MEDICAL HISTORY: Past Medical History:  Diagnosis Date  . Asthma     MEDICATIONS AT HOME: Prior to Admission medications   Medication Sig Start Date End Date Taking? Authorizing Provider  albuterol (PROVENTIL HFA;VENTOLIN HFA) 108 (90 Base) MCG/ACT inhaler Inhale 2 puffs into the lungs every 6 (six) hours as needed for wheezing or shortness of breath. 03/29/16   Funches, Adriana Mccallum, MD  cetirizine (ZYRTEC) 10 MG tablet Take 1 tablet (10 mg total) by mouth daily. 02/02/16   Funches, Adriana Mccallum, MD  cyclobenzaprine  (FLEXERIL) 10 MG tablet TAKE 1 TABLET BY MOUTH 3 TIMES DAILY AS NEEDED FOR MUSCLE SPASMS. 05/17/16   Boykin Nearing, MD  famotidine (PEPCID) 20 MG tablet One at bedtime 05/13/16   Tanda Rockers, MD  ibuprofen (ADVIL,MOTRIN) 600 MG tablet TAKE 1 TABLET BY MOUTH EVERY 8 HOURS AS NEEDED. 05/17/16   Boykin Nearing, MD  Multiple Vitamins-Minerals (MULTIVITAMIN WITH MINERALS) tablet Take 1 tablet by mouth daily. Reported on 01/01/2016    [provider]  pantoprazole (PROTONIX) 40 MG tablet Take 1 tablet (40 mg total) by mouth daily. 02/02/16   Funches, Adriana Mccallum, MD     Objective:  EXAM:   Vitals:   04/14/17 1023  BP: 132/82  Pulse: 88  Resp: 18  Temp: 97.2 F (36.2 C)  TempSrc: Oral  SpO2: 97%  Weight: 276 lb (125.2 kg)  Height: 5' 7.5" (1.715 m)    General appearance : A&OX3. NAD. Non-toxic-appearing, obese HEENT: Atraumatic and Normocephalic.  PERRLA. EOM intact.  Neck: supple, no JVD. No cervical lymphadenopathy. No thyromegaly Chest/Lungs:  Breathing-non-labored, Good air entry bilaterally, breath sounds normal without rales, rhonchi, or wheezing  CVS: S1 S2 regular, no murmurs, gallops, rubs  Abdomen: Bowel sounds present, Non tender and not distended with no gaurding, rigidity or rebound. Extremities: Bilateral Lower Ext shows minimal edema, both legs are warm to touch with = pulse throughout. N-V  intact.  B shoulders and hips examined.  No abnormalities noted.   Neurology:  CN II-XII grossly intact, Non focal.   Psych:  TP linear. J/I WNL. Normal speech. Appropriate eye contact and affect.  Skin:  No Rash  Data Review Lab Results  Component Value Date   HGBA1C 6.0 (H) 03/29/2016   HGBA1C 5.9 (H) 12/02/2014     Assessment & Plan   1. Hyperglycemia - HgB A1c - Glucose (CBG) - Basic metabolic panel I have had a lengthy discussion and provided education about insulin resistance and the intake of too much sugar/refined carbohydrates.  I have advised the patient  to work at a goal of eliminating sugary drinks, candy, desserts, sweets, refined sugars, processed foods, and white carbohydrates.  The patient expresses understanding.   2. Paresthesia - TSH - Vitamin D, [id number] - Basic metabolic panel Consider gabapentin.  Continue exercise and weight loss efforts!  3.  Soreness after fall 2 weeks ago-advil or tylenol per package prn  Patient have been counseled extensively about nutrition and exercise  Return in about 1 month (around 05/14/2017) for Dr Wynetta Emery for f/up paresthesias.  The patient was given clear instructions to go to ER or return to medical center if symptoms don't improve, worsen or new problems develop. The patient verbalized understanding. The patient was told to call to get lab results if they haven't heard anything in the next week.     Freeman Caldron, PA-C [facility] [place], [place]   04/14/2017, 10:54 AM

## 2017-04-15 LAB — BASIC METABOLIC PANEL
BUN / CREAT RATIO: 17 (ref 9–23)
BUN: 11 mg/dL (ref 6–24)
CALCIUM: 9.8 mg/dL (ref 8.7–10.2)
CHLORIDE: 101 mmol/L (ref 96–106)
CO2: 28 mmol/L (ref 18–29)
Creatinine, Ser: 0.66 mg/dL (ref 0.57–1.00)
GFR calc Af Amer: 115 mL/min/{1.73_m2} (ref >59)
GFR calc non Af Amer: 100 mL/min/{1.73_m2} (ref >59)
GLUCOSE: 88 mg/dL (ref 65–99)
Potassium: 4.4 mmol/L (ref 3.5–5.2)
Sodium: 144 mmol/L (ref 134–144)

## 2017-04-15 LAB — VITAMIN D 25 HYDROXY (VIT D DEFICIENCY, FRACTURES): VIT D 25 HYDROXY: 39.6 ng/mL (ref 30.0–100.0)

## 2017-04-15 LAB — TSH: TSH: 2.92 u[IU]/mL (ref 0.450–4.500)

## 2017-05-08 ENCOUNTER — Telehealth: Payer: Self-pay | Admitting: *Deleted

## 2017-05-08 NOTE — Telephone Encounter (Signed)
Patient is aware of labs being normal. Medical Assistant left message on patient's home and cell voicemail. Voicemail states to give a call back to Nubia with [facility] at [phone].  

## 2017-05-08 NOTE — Telephone Encounter (Signed)
-----   Message from [place] Donovan, [place] sent at 04/17/2017  2:21 PM EDT ----- Please call patient and let her know that her labs were all normal. (thyroid, vitamin D, kidney function, and electrolytes)  Thanks, Freeman Caldron, PA-C

## 2017-05-26 ENCOUNTER — Ambulatory Visit: Payer: Self-pay | Attending: Family Medicine | Admitting: Family Medicine

## 2017-05-26 ENCOUNTER — Encounter: Payer: Self-pay | Admitting: Family Medicine

## 2017-05-26 VITALS — BP 138/82 | HR 76 | Temp 98.4°F | Wt 279.4 lb

## 2017-05-26 DIAGNOSIS — M722 Plantar fascial fibromatosis: Secondary | ICD-10-CM | POA: Insufficient documentation

## 2017-05-26 DIAGNOSIS — Z23 Encounter for immunization: Secondary | ICD-10-CM

## 2017-05-26 DIAGNOSIS — Z6841 Body Mass Index (BMI) 40.0 and over, adult: Secondary | ICD-10-CM | POA: Insufficient documentation

## 2017-05-26 DIAGNOSIS — R202 Paresthesia of skin: Secondary | ICD-10-CM | POA: Insufficient documentation

## 2017-05-26 MED ORDER — IBUPROFEN 600 MG PO TABS: 600.0000 mg | ORAL_TABLET | Freq: Three times a day (TID) | ORAL | 1 refills | Status: DC | PRN

## 2017-05-26 MED FILL — IBUPROFEN 600 MG TABLET: 600 | 10 days supply | Qty: 30 | Fill #0

## 2017-05-26 NOTE — Addendum Note (Signed)
Addended by: Gomez Cleverly on: 05/26/2017 09:27 AM   Modules accepted: Orders

## 2017-05-26 NOTE — Patient Instructions (Signed)
Diagnoses and all orders for this visit:  Need for Tdap vaccination  Plantar fasciitis, left -     DG Foot 2 Views Left; Future -     ibuprofen (ADVIL,MOTRIN) 600 MG tablet; Take 1 tablet (600 mg total) by mouth every 8 (eight) hours as needed.   Ice feet for 15 minutes 2-3 times per day by doing the following  1. Freezing water in plastic bottle and rolling bottle under foot Or Placing an ice pack under under heel   F/u in 12 weeks sooner if needed   Dr. Adrian Blackwater  Plantar Fasciitis Rehab Ask your health care provider which exercises are safe for you. Do exercises exactly as told by your health care provider and adjust them as directed. It is normal to feel mild stretching, pulling, tightness, or discomfort as you do these exercises, but you should stop right away if you feel sudden pain or your pain gets worse. Do not begin these exercises until told by your health care provider. Stretching and range of motion exercises These exercises warm up your muscles and joints and improve the movement and flexibility of your foot. These exercises also help to relieve pain. Exercise A: Plantar fascia stretch  1. Sit with your left / right leg crossed over your opposite knee. 2. Hold your heel with one hand with that thumb near your arch. With your other hand, hold your toes and gently pull them back toward the top of your foot. You should feel a stretch on the bottom of your toes or your foot or both. 3. Hold this stretch for__________ seconds. 4. Slowly release your toes and return to the starting position. Repeat __________ times. Complete this exercise __________ times a day. Exercise B: Gastroc, standing  1. Stand with your hands against a wall. 2. Extend your left / right leg behind you, and bend your front knee slightly. 3. Keeping your heels on the floor and keeping your back knee straight, shift your weight toward the wall without arching your back. You should feel a gentle stretch in  your left / right calf. 4. Hold this position for __________ seconds. Repeat __________ times. Complete this exercise __________ times a day. Exercise C: Soleus, standing 1. Stand with your hands against a wall. 2. Extend your left / right leg behind you, and bend your front knee slightly. 3. Keeping your heels on the floor, bend your back knee and slightly shift your weight over the back leg. You should feel a gentle stretch deep in your calf. 4. Hold this position for __________ seconds. Repeat __________ times. Complete this exercise __________ times a day. Exercise D: Gastrocsoleus, standing 1. Stand with the ball of your left / right foot on a step. The ball of your foot is on the walking surface, right under your toes. 2. Keep your other foot firmly on the same step. 3. Hold onto the wall or a railing for balance. 4. Slowly lift your other foot, allowing your body weight to press your heel down over the edge of the step. You should feel a stretch in your left / right calf. 5. Hold this position for __________ seconds. 6. Return both feet to the step. 7. Repeat this exercise with a slight bend in your left / right knee. Repeat __________ times with your left / right knee straight and __________ times with your left / right knee bent. Complete this exercise __________ times a day. Balance exercise This exercise builds your balance and strength control  of your arch to help take pressure off your plantar fascia. Exercise E: Single leg stand 1. Without shoes, stand near a railing or in a doorway. You may hold onto the railing or door frame as needed. 2. Stand on your left / right foot. Keep your big toe down on the floor and try to keep your arch lifted. Do not let your foot roll inward. 3. Hold this position for __________ seconds. 4. If this exercise is too easy, you can try it with your eyes closed or while standing on a pillow. Repeat __________ times. Complete this exercise __________  times a day. This information is not intended to replace advice given to you by your health care provider. Make sure you discuss any questions you have with your health care provider. Document Released: 10/24/2005 Document Revised: 06/28/2016 Document Reviewed: 09/07/2015 Elsevier Interactive Patient Education  2018 [employer].

## 2017-05-26 NOTE — Progress Notes (Signed)
Subjective:  Patient ID: Jasmine Martin, female    DOB: 1960-11-08  Age: 56 y.o. MRN: [id number]  CC: Foot Pain and Numbness   HPI Jasmine Martin has severe obesity, history of esophageal stricture and eosinophilic esophagitis she presents for    1. Paresthesias: in both legs. Started in 03/2017. She has a severe episode of her legs going to sleep while sitting on the toilet. She presented to care last month. Her A1c was slightly elevated. Her TSH, BMP and vit D were normal. She reports paresthesias have improved.   2. Left heel pain: posterior and medial pain. No known injury. No redness or swelling. Sharp pain when rising from standing.    Social History  Substance Use Topics  . Smoking status: Never Smoker  . Smokeless tobacco: Never Used  . Alcohol use No    Outpatient Medications Prior to Visit  Medication Sig Dispense Refill  . albuterol (PROVENTIL HFA;VENTOLIN HFA) 108 (90 Base) MCG/ACT inhaler Inhale 2 puffs into the lungs every 6 (six) hours as needed for wheezing or shortness of breath. 8 g 3  . cetirizine (ZYRTEC) 10 MG tablet Take 1 tablet (10 mg total) by mouth daily. 30 tablet 5  . cyclobenzaprine (FLEXERIL) 10 MG tablet TAKE 1 TABLET BY MOUTH 3 TIMES DAILY AS NEEDED FOR MUSCLE SPASMS. 15 tablet 0  . famotidine (PEPCID) 20 MG tablet One at bedtime    . ibuprofen (ADVIL,MOTRIN) 600 MG tablet TAKE 1 TABLET BY MOUTH EVERY 8 HOURS AS NEEDED. 30 tablet 0  . Multiple Vitamins-Minerals (MULTIVITAMIN WITH MINERALS) tablet Take 1 tablet by mouth daily. Reported on 01/01/2016    . pantoprazole (PROTONIX) 40 MG tablet Take 1 tablet (40 mg total) by mouth daily. 30 tablet 5   No facility-administered medications prior to visit.     ROS Review of Systems  Constitutional: Negative for chills and fever.  HENT: Positive for tinnitus (with hyperacusis).   Eyes: Negative for visual disturbance.  Gastrointestinal: Negative for abdominal pain and blood in stool.    Musculoskeletal: Positive for arthralgias. Negative for back pain.  Skin: Negative for rash.  Allergic/Immunologic: Negative for immunocompromised state.  Neurological: Positive for headaches (L temple ).  Hematological: Negative for adenopathy. Does not bruise/bleed easily.  Psychiatric/Behavioral: Negative for dysphoric mood and suicidal ideas.   Lab Results  Component Value Date   HGBA1C 5.6 04/14/2017    Objective:  BP 138/82   Pulse 76   Temp 98.4 F (36.9 C) (Oral)   Wt 279 lb 6.4 oz (126.7 kg)   SpO2 94%   BMI 43.11 kg/m    BP/Weight 05/26/2017 04/14/2017 9/37/9024  Systolic BP 097 353 299  Diastolic BP 82 82 74  Wt. (Lbs) 279.4 276 276  BMI 43.11 42.59 41.97   Wt Readings from Last 3 Encounters:  05/26/17 279 lb 6.4 oz (126.7 kg)  04/14/17 276 lb (125.2 kg)  07/20/16 276 lb (125.2 kg)    No data found.  Physical Exam  Constitutional: She is oriented to person, place, and time. She appears well-developed and well-nourished. No distress.  Morbidly obese   HENT:  Head: Normocephalic and atraumatic.  Right Ear: Tympanic membrane, external ear and ear canal normal.  Left Ear: Tympanic membrane, external ear and ear canal normal.  Nose: Mucosal edema present.  Cardiovascular: Normal rate, regular rhythm, normal heart sounds and intact distal pulses.   Pulmonary/Chest: Effort normal and breath sounds normal.  Musculoskeletal: She exhibits no edema.  Left ankle: Achilles tendon normal.       Feet:  Neurological: She is alert and oriented to person, place, and time.  Skin: Skin is warm and dry. No rash noted.  Psychiatric: She has a normal mood and affect.   Lab Results  Component Value Date   HGBA1C 5.6 04/14/2017   Lab Results  Component Value Date   TSH 2.920 04/14/2017     Chemistry      Component Value Date/Time   NA 144 04/14/2017 1101   K 4.4 04/14/2017 1101   CL 101 04/14/2017 1101   CO2 28 04/14/2017 1101   BUN 11 04/14/2017 1101    CREATININE 0.66 04/14/2017 1101   CREATININE 0.66 03/29/2016 1056      Component Value Date/Time   CALCIUM 9.8 04/14/2017 1101   ALKPHOS 53 03/29/2016 1056   AST 35 03/29/2016 1056   ALT 49 (H) 03/29/2016 1056   BILITOT 0.5 03/29/2016 1056       Assessment & Plan:  Diagnoses and all orders for this visit:  Need for Tdap vaccination  Plantar fasciitis, left -     DG Foot 2 Views Left; Future -     ibuprofen (ADVIL,MOTRIN) 600 MG tablet; Take 1 tablet (600 mg total) by mouth every 8 (eight) hours as needed.  Paresthesia    Follow-up: Return in about 8 weeks (around 07/21/2017) for heel pain .   Boykin Nearing MD

## 2017-07-24 ENCOUNTER — Ambulatory Visit: Payer: Self-pay | Admitting: Internal Medicine

## 2017-08-04 ENCOUNTER — Ambulatory Visit: Payer: Self-pay | Admitting: Internal Medicine

## 2017-08-08 ENCOUNTER — Encounter: Payer: Self-pay | Admitting: Internal Medicine

## 2017-08-08 ENCOUNTER — Ambulatory Visit: Payer: Self-pay | Attending: Internal Medicine | Admitting: Internal Medicine

## 2017-08-08 ENCOUNTER — Ambulatory Visit (HOSPITAL_COMMUNITY)
Admission: RE | Admit: 2017-08-08 | Discharge: 2017-08-08 | Disposition: A | Discharge status: Home / Self Care | Payer: Self-pay | Source: Ambulatory Visit | Attending: Family Medicine | Admitting: Family Medicine

## 2017-08-08 VITALS — BP 136/83 | HR 83 | Temp 98.5°F | Resp 18 | Ht 66.0 in | Wt 285.0 lb

## 2017-08-08 DIAGNOSIS — K219 Gastro-esophageal reflux disease without esophagitis: Secondary | ICD-10-CM | POA: Insufficient documentation

## 2017-08-08 DIAGNOSIS — G8929 Other chronic pain: Secondary | ICD-10-CM | POA: Insufficient documentation

## 2017-08-08 DIAGNOSIS — Z23 Encounter for immunization: Secondary | ICD-10-CM | POA: Insufficient documentation

## 2017-08-08 DIAGNOSIS — Z8371 Family history of colonic polyps: Secondary | ICD-10-CM | POA: Insufficient documentation

## 2017-08-08 DIAGNOSIS — M79673 Pain in unspecified foot: Secondary | ICD-10-CM

## 2017-08-08 DIAGNOSIS — M722 Plantar fascial fibromatosis: Secondary | ICD-10-CM

## 2017-08-08 DIAGNOSIS — M79672 Pain in left foot: Secondary | ICD-10-CM | POA: Insufficient documentation

## 2017-08-08 DIAGNOSIS — M25572 Pain in left ankle and joints of left foot: Secondary | ICD-10-CM | POA: Insufficient documentation

## 2017-08-08 DIAGNOSIS — Z800 Family history of malignant neoplasm of digestive organs: Secondary | ICD-10-CM | POA: Insufficient documentation

## 2017-08-08 DIAGNOSIS — J45909 Unspecified asthma, uncomplicated: Secondary | ICD-10-CM | POA: Insufficient documentation

## 2017-08-08 DIAGNOSIS — Z79899 Other long term (current) drug therapy: Secondary | ICD-10-CM | POA: Insufficient documentation

## 2017-08-08 DIAGNOSIS — Z6841 Body Mass Index (BMI) 40.0 and over, adult: Secondary | ICD-10-CM | POA: Insufficient documentation

## 2017-08-08 DIAGNOSIS — M7531 Calcific tendinitis of right shoulder: Secondary | ICD-10-CM | POA: Insufficient documentation

## 2017-08-08 DIAGNOSIS — Z91040 Latex allergy status: Secondary | ICD-10-CM | POA: Insufficient documentation

## 2017-08-08 DIAGNOSIS — M25511 Pain in right shoulder: Secondary | ICD-10-CM | POA: Insufficient documentation

## 2017-08-08 DIAGNOSIS — Z9181 History of falling: Secondary | ICD-10-CM | POA: Insufficient documentation

## 2017-08-08 MED ORDER — IBUPROFEN 800 MG PO TABS: 800.0000 mg | ORAL_TABLET | Freq: Three times a day (TID) | ORAL | 2 refills | Status: DC | PRN

## 2017-08-08 MED FILL — IBUPROFEN 800 MG TABLET: 800 | 20 days supply | Qty: 60 | Fill #0

## 2017-08-08 NOTE — Progress Notes (Signed)
Patient ID: Jasmine Martin, female    DOB: [birth year]  MRN: [id number]  CC: Shoulder Pain   Subjective: Jasmine Martin is a 56 y.o. female who presents for chronic ds management. Last saw Dr. Adrian Blackwater 05/2017. Pt's mother is with her Her concerns today include:  Pt with hx of plantar fasciitis, GERD, eosinophilic esophagitis, obesity, upper airway cough syndrome  1.  LT foot pain -still having soreness on the bottom heel. Props foot up at nights. She massages it on the floor before getting up. Once she gets moving pain about same but sometimes it gets stronger.  -started about 4 mths ago post a fall at home. She fell backward on the stairs carrying some containers backwards down the stairs -has Dr. Felicie Morn inserts -takes Ibuprofen at nights.  -has not been able to exercise for several wks  2. RT shoulder pain -started post fall (mentioned above) 4 mths ago; landed on RT shoulder on cement floor Has problem/pain with ROM and elevation -tried doing shoulder exercises for a while but pain worse over past 2-3 wks.  -rates pain 10/10.  Unable to lay on RT side. Takes Ibuprofen at nights which helps her sleep. Patient Active Problem List   Diagnosis Date Noted  . Upper airway cough syndrome 05/13/2016  . Eosinophilic esophagitis 50/53/9767  . Elevated BP 03/29/2016  . History of asthma 03/29/2016  . Seasonal allergies 02/02/2016  . GERD (gastroesophageal reflux disease) 01/11/2016  . Severe obesity (BMI >= 40) ([place]) 01/11/2016     Current Outpatient Prescriptions on File Prior to Visit  Medication Sig Dispense Refill  . albuterol (PROVENTIL HFA;VENTOLIN HFA) 108 (90 Base) MCG/ACT inhaler Inhale 2 puffs into the lungs every 6 (six) hours as needed for wheezing or shortness of breath. 8 g 3  . cetirizine (ZYRTEC) 10 MG tablet Take 1 tablet (10 mg total) by mouth daily. 30 tablet 5  . cyclobenzaprine (FLEXERIL) 10 MG tablet TAKE 1 TABLET BY MOUTH 3 TIMES DAILY AS NEEDED FOR  MUSCLE SPASMS. 15 tablet 0  . famotidine (PEPCID) 20 MG tablet One at bedtime    . Multiple Vitamins-Minerals (MULTIVITAMIN WITH MINERALS) tablet Take 1 tablet by mouth daily. Reported on 01/01/2016    . pantoprazole (PROTONIX) 40 MG tablet Take 1 tablet (40 mg total) by mouth daily. 30 tablet 5   No current facility-administered medications on file prior to visit.     Allergies  Allergen Reactions  . Latex Itching and Rash    Social History   Social History  . Marital status: Single    Spouse name: N/A  . Number of children: 0  . Years of education: N/A   Occupational History  . Unemployed    Social History Main Topics  . Smoking status: Never Smoker  . Smokeless tobacco: Never Used  . Alcohol use No  . Drug use: No  . Sexual activity: Not Currently   Other Topics Concern  . Not on file   Social History Narrative  . No narrative on file    Family History  Problem Relation Age of Onset  . Liver cancer Father   . Hypertension Maternal Aunt   . Colon polyps Mother   . Diabetes Maternal Grandfather   . Diabetes Maternal Aunt   . Esophageal cancer Paternal Grandfather   . Colon cancer Neg Hx   . Kidney disease Neg Hx     Past Surgical History:  Procedure Laterality Date  . ABDOMINAL HYSTERECTOMY    .  CHOLECYSTECTOMY      ROS: Review of Systems Negative except as stated above PHYSICAL EXAM: BP 136/83 (BP Location: Right Arm, Patient Position: Sitting, Cuff Size: Large)   Pulse 83   Temp 98.5 F (36.9 C) (Oral)   Resp 18   Ht 5\' 6"  (1.676 m)   Wt 285 lb (129.3 kg)   SpO2 96%   BMI 46.00 kg/m   Wt Readings from Last 3 Encounters:  08/08/17 285 lb (129.3 kg)  05/26/17 279 lb 6.4 oz (126.7 kg)  04/14/17 276 lb (125.2 kg)    Physical Exam General appearance - alert, well appearing, and in no distress Mental status - alert, oriented to person, place, and time, normal mood, behavior, speech, dress, motor activity, and thought processes Musculoskeletal  -RT shoulder: no point tenderness. Very limited range of motion with a lot of guarding. Drop arm test negative Left foot: No edema. Mild to moderate tenderness on palpation of the plantar heel. Moderate discomfort on the plantar heel with dorsiflexion of the foot   ASSESSMENT AND PLAN: There are no diagnoses linked to this encounter. 1. Chronic right shoulder pain -Likely has a rotator cuff tear however we will start with plain x-ray to rule out fracture. If negative we will get an MRI. -Patient feels ibuprofen provides adequate pain control. Refill given - DG Shoulder Right; Future  2. Intractable heel pain -Given history of fall we need to rule out fracture. Encouraged patient to go and have x-ray done as soon as possible. Ibuprofen when necessary - DG Foot Complete Left; Future   3. Need for influenza vaccination - Flu Vaccine QUAD 6+ mos PF IM (Fluarix Quad PF)  Patient was given the opportunity to ask questions.  Patient verbalized understanding of the plan and was able to repeat key elements of the plan.   Orders Placed This Encounter  Procedures  . DG Shoulder Right  . DG Foot Complete Left  . Flu Vaccine QUAD 6+ mos PF IM (Fluarix Quad PF)     Requested Prescriptions   Signed Prescriptions Disp Refills  . ibuprofen (ADVIL,MOTRIN) 800 MG tablet 60 tablet 2    Sig: Take 1 tablet (800 mg total) by mouth every 8 (eight) hours as needed.    Return in about 2 months (around 10/08/2017).  Karle Plumber, MD, FACP

## 2017-08-08 NOTE — Patient Instructions (Signed)
Please go to radiology at [facility] to have the x-ray of your right shoulder and left foot.   If the shoulder x-ray is negative, we will plan to do an MRI.

## 2017-08-09 NOTE — Addendum Note (Signed)
Addended by: Karle Plumber B on: 08/09/2017 11:03 AM   Modules accepted: Orders

## 2017-08-09 NOTE — Progress Notes (Signed)
X-ray of Lt foot without fx or bone spur. Will refer to podiatrist.  The x-ray of RT shoulder revealed Ca+ tendinitis but no fracture. Given the limited mobility and hx of fall landing on the shoulder, will proceed with getting MRI.

## 2017-08-10 ENCOUNTER — Telehealth: Payer: Self-pay

## 2017-08-10 NOTE — Telephone Encounter (Signed)
Contacted pt to go over xray results pt didn't answer lvm asking pt tog ive me a call at her earliest convenience   If pt calls back please give results: right shoulder revealed some tendinitis but no fracture. Will order MRI to eval for rotator cuff tear. Please schedule for her. Order placed. The x-ray of LT foot did not reveal any fracture or bone spur to the heel. Will refer to podiatrist.

## 2017-08-23 ENCOUNTER — Ambulatory Visit: Payer: Self-pay | Attending: Internal Medicine

## 2017-08-23 MED FILL — IBUPROFEN 800 MG TABLET: 800 | 20 days supply | Qty: 60 | Fill #1

## 2017-09-19 MED FILL — IBUPROFEN 800 MG TABLET: 800 | 20 days supply | Qty: 60 | Fill #2

## 2017-10-09 ENCOUNTER — Ambulatory Visit: Payer: Self-pay | Attending: Internal Medicine | Admitting: Internal Medicine

## 2017-10-09 ENCOUNTER — Encounter: Payer: Self-pay | Admitting: Internal Medicine

## 2017-10-09 VITALS — BP 136/80 | HR 76 | Temp 98.6°F | Resp 18 | Ht 66.0 in | Wt 285.0 lb

## 2017-10-09 DIAGNOSIS — M25511 Pain in right shoulder: Secondary | ICD-10-CM | POA: Insufficient documentation

## 2017-10-09 DIAGNOSIS — K219 Gastro-esophageal reflux disease without esophagitis: Secondary | ICD-10-CM | POA: Insufficient documentation

## 2017-10-09 DIAGNOSIS — Z6841 Body Mass Index (BMI) 40.0 and over, adult: Secondary | ICD-10-CM | POA: Insufficient documentation

## 2017-10-09 DIAGNOSIS — K200 Eosinophilic esophagitis: Secondary | ICD-10-CM | POA: Insufficient documentation

## 2017-10-09 DIAGNOSIS — R05 Cough: Secondary | ICD-10-CM | POA: Insufficient documentation

## 2017-10-09 DIAGNOSIS — M79673 Pain in unspecified foot: Secondary | ICD-10-CM

## 2017-10-09 DIAGNOSIS — J45909 Unspecified asthma, uncomplicated: Secondary | ICD-10-CM | POA: Insufficient documentation

## 2017-10-09 DIAGNOSIS — J988 Other specified respiratory disorders: Secondary | ICD-10-CM | POA: Insufficient documentation

## 2017-10-09 DIAGNOSIS — G8929 Other chronic pain: Secondary | ICD-10-CM

## 2017-10-09 DIAGNOSIS — M722 Plantar fascial fibromatosis: Secondary | ICD-10-CM | POA: Insufficient documentation

## 2017-10-09 DIAGNOSIS — Z79899 Other long term (current) drug therapy: Secondary | ICD-10-CM | POA: Insufficient documentation

## 2017-10-09 MED ORDER — IBUPROFEN 800 MG PO TABS: 800.0000 mg | ORAL_TABLET | Freq: Three times a day (TID) | ORAL | 2 refills | Status: DC | PRN

## 2017-10-09 MED FILL — IBUPROFEN 800 MG TABLET: 800 | 20 days supply | Qty: 60 | Fill #0

## 2017-10-09 NOTE — Patient Instructions (Addendum)
Please call the podiatry office back to schedule your appointment to see the specialist for your left foot. We have rescheduled the MRI of your shoulder.  Once we have the results we will refer you to orthopedics. You can stop the famotidine and continue the Protonix. Try to increase your exercise to 5 minutes twice a day.  Follow a Healthy Eating Plan - You can do it! Limit sugary drinks.  Avoid sodas, sweet tea, sport or energy drinks, or fruit drinks.  Drink water, lo-fat milk, or diet drinks. Limit snack foods.   Cut back on candy, cake, cookies, chips, ice cream.  These are a special treat, only in small amounts. Eat plenty of vegetables.  Especially dark green, red, and orange vegetables. Aim for at least 3 servings a day. More is better! Include fruit in your daily diet.  Whole fruit is much healthier than fruit juice! Limit "white" bread, "white" pasta, "white" rice.   Choose "100% whole grain" products, brown or wild rice. Avoid fatty meats. Try "Meatless Monday" and choose eggs or beans one day a week.  When eating meat, choose lean meats like chicken, [place], and fish.  Grill, broil, or bake meats instead of frying, and eat poultry without the skin. Eat less salt.  Avoid frozen pizzas, frozen dinners and salty foods.  Use seasonings other than salt in cooking.  This can help blood pressure and keep you from swelling Beer, wine and liquor have calories.  If you can safely drink alcohol, limit to 1 drink per day for women, 2 drinks for men

## 2017-10-09 NOTE — Progress Notes (Signed)
Patient ID: Jasmine Martin, female    DOB: 05-01-1961  MRN: [id number]  CC: Follow-up   Subjective: Jasmine Martin is a 56 y.o. female who presents for 2 mth f/u Her concerns today include:  Pt with hx of plantar fasciitis, GERD, eosinophilic esophagitis, obesity, upper airway cough syndrome  1. LT foot pain: still l having pain.  Did not pursue podiatry appt because did not want bill. Has [facility].  -did get call regarding appt for Podiatry but she forgot to call back  2. RT shoulder pain: started post fall (mentioned above) 5 mths ago; landed on RT shoulder on cement Martin Has problem/pain with ROM and elevation -tried doing shoulder exercises for a while but pain worse over past 2-3 wks.  -rates pain 10/10.  Unable to lay on RT side. Takes Ibuprofen at nights which helps her sleep. -referred for MRI to r/o rotaor cuff injury after x-rays showed some Ca+ tendinitis. She did not go because she though she would be billed -using Ibuprofen which helps some.    3. GERD: under good control with Protonix and Pepcid  4.  Obesity: "I'm trying to do better." Has a Gazelle which she uses 1-2 x/day for 3-5 mins.  -eating smaller portions  Patient Active Problem List   Diagnosis Date Noted  . Upper airway cough syndrome 05/13/2016  . Eosinophilic esophagitis 67/34/1937  . Elevated BP 03/29/2016  . History of asthma 03/29/2016  . Seasonal allergies 02/02/2016  . GERD (gastroesophageal reflux disease) 01/11/2016  . Severe obesity (BMI >= 40) ([place]) 01/11/2016     Current Outpatient Medications on File Prior to Visit  Medication Sig Dispense Refill  . albuterol (PROVENTIL HFA;VENTOLIN HFA) 108 (90 Base) MCG/ACT inhaler Inhale 2 puffs into the lungs every 6 (six) hours as needed for wheezing or shortness of breath. 8 g 3  . cetirizine (ZYRTEC) 10 MG tablet Take 1 tablet (10 mg total) by mouth daily. 30 tablet 5  . cyclobenzaprine (FLEXERIL) 10 MG tablet TAKE 1 TABLET  BY MOUTH 3 TIMES DAILY AS NEEDED FOR MUSCLE SPASMS. 15 tablet 0  . famotidine (PEPCID) 20 MG tablet One at bedtime    . Multiple Vitamins-Minerals (MULTIVITAMIN WITH MINERALS) tablet Take 1 tablet by mouth daily. Reported on 01/01/2016    . pantoprazole (PROTONIX) 40 MG tablet Take 1 tablet (40 mg total) by mouth daily. 30 tablet 5   No current facility-administered medications on file prior to visit.     Allergies  Allergen Reactions  . Latex Itching and Rash    Social History   Socioeconomic History  . Marital status: Single    Spouse name: Not on file  . Number of children: 0  . Years of education: Not on file  . Highest education level: Not on file  Social Needs  . Financial resource strain: Not on file  . Food insecurity - worry: Not on file  . Food insecurity - inability: Not on file  . Transportation needs - medical: Not on file  . Transportation needs - non-medical: Not on file  Occupational History  . Occupation: Unemployed  Tobacco Use  . Smoking status: Never Smoker  . Smokeless tobacco: Never Used  Substance and Sexual Activity  . Alcohol use: No    Alcohol/week: 0.0 oz  . Drug use: No  . Sexual activity: Not Currently  Other Topics Concern  . Not on file  Social History Narrative  . Not on file  Family History  Problem Relation Age of Onset  . Liver cancer Father   . Hypertension Maternal Aunt   . Colon polyps Mother   . Diabetes Maternal Grandfather   . Diabetes Maternal Aunt   . Esophageal cancer Paternal Grandfather   . Colon cancer Neg Hx   . Kidney disease Neg Hx     Past Surgical History:  Procedure Laterality Date  . ABDOMINAL HYSTERECTOMY    . CHOLECYSTECTOMY      ROS: Review of Systems Neg except as above PHYSICAL EXAM: BP 136/80 (BP Location: Left Arm, Patient Position: Sitting, Cuff Size: Large)   Pulse 76   Temp 98.6 F (37 C) (Oral)   Resp 18   Ht 5\' 6"  (1.676 m)   Wt 285 lb (129.3 kg)   SpO2 97%   BMI 46.00 kg/m     Wt Readings from Last 3 Encounters:  10/09/17 285 lb (129.3 kg)  08/08/17 285 lb (129.3 kg)  05/26/17 279 lb 6.4 oz (126.7 kg)   Physical Exam General appearance - alert, well appearing, and in no distress Mental status - alert, oriented to person, place, and time, normal mood, behavior, speech, dress, motor activity, and thought processes Mouth - mucous membranes moist, pharynx normal without lesions Chest - clear to auscultation, no wheezes, rales or rhonchi, symmetric air entry Heart - normal rate, regular rhythm, normal S1, S2, no murmurs, rubs, clicks or gallops Musculoskeletal - discomfort with attempted passive elevation of RT shoulder Extremities - peripheral pulses normal, no pedal edema, no clubbing or cyanosis   ASSESSMENT AND PLAN: 1. Chronic right shoulder pain -pt advised to check and see what % [facility] she has [facility] to call and reschedule MRI - ibuprofen (ADVIL,MOTRIN) 800 MG tablet; Take 1 tablet (800 mg total) by mouth every 8 (eight) hours as needed.  Dispense: 60 tablet; Refill: 2  2. Intractable heel pain -message sent to referral person to f/u with her to give phone number of the podiatry practice she was referred to - ibuprofen (ADVIL,MOTRIN) 800 MG tablet; Take 1 tablet (800 mg total) by mouth every 8 (eight) hours as needed.  Dispense: 60 tablet; Refill: 2  3. Gastroesophageal reflux disease, esophagitis presence not specified Stable. Recommend d/c Pepcis. Continue Protonix  4. Morbid obesity, unspecified obesity type ([place]) -healthy eating habits and exercise counselling given  Patient was given the opportunity to ask questions.  Patient verbalized understanding of the plan and was able to repeat key elements of the plan.   No orders of the defined types were placed in this encounter.    Requested Prescriptions   Signed Prescriptions Disp Refills  . ibuprofen (ADVIL,MOTRIN) 800 MG tablet 60 tablet 2    Sig: Take 1 tablet (800 mg  total) by mouth every 8 (eight) hours as needed.    Return in about 3 months (around 01/07/2018).  Karle Plumber, MD, FACP

## 2017-10-16 ENCOUNTER — Ambulatory Visit (HOSPITAL_COMMUNITY): Admission: RE | Admit: 2017-10-16 | Payer: Self-pay | Source: Ambulatory Visit

## 2017-10-27 ENCOUNTER — Ambulatory Visit (HOSPITAL_COMMUNITY): Payer: Self-pay

## 2017-11-03 ENCOUNTER — Ambulatory Visit: Payer: Self-pay | Admitting: Podiatry

## 2017-11-20 ENCOUNTER — Ambulatory Visit: Payer: Self-pay | Attending: Internal Medicine

## 2017-11-23 ENCOUNTER — Encounter: Payer: Self-pay | Admitting: Internal Medicine

## 2017-11-23 ENCOUNTER — Ambulatory Visit: Payer: Self-pay | Attending: Internal Medicine | Admitting: Internal Medicine

## 2017-11-23 VITALS — BP 136/90 | HR 79 | Temp 98.6°F | Resp 16 | Wt 291.8 lb

## 2017-11-23 DIAGNOSIS — Z560 Unemployment, unspecified: Secondary | ICD-10-CM | POA: Insufficient documentation

## 2017-11-23 DIAGNOSIS — M722 Plantar fascial fibromatosis: Secondary | ICD-10-CM | POA: Insufficient documentation

## 2017-11-23 DIAGNOSIS — K219 Gastro-esophageal reflux disease without esophagitis: Secondary | ICD-10-CM | POA: Insufficient documentation

## 2017-11-23 DIAGNOSIS — R030 Elevated blood-pressure reading, without diagnosis of hypertension: Secondary | ICD-10-CM | POA: Insufficient documentation

## 2017-11-23 DIAGNOSIS — F40240 Claustrophobia: Secondary | ICD-10-CM | POA: Insufficient documentation

## 2017-11-23 DIAGNOSIS — Z79899 Other long term (current) drug therapy: Secondary | ICD-10-CM | POA: Insufficient documentation

## 2017-11-23 DIAGNOSIS — Z6841 Body Mass Index (BMI) 40.0 and over, adult: Secondary | ICD-10-CM | POA: Insufficient documentation

## 2017-11-23 DIAGNOSIS — M25511 Pain in right shoulder: Secondary | ICD-10-CM | POA: Insufficient documentation

## 2017-11-23 DIAGNOSIS — K200 Eosinophilic esophagitis: Secondary | ICD-10-CM | POA: Insufficient documentation

## 2017-11-23 DIAGNOSIS — G8929 Other chronic pain: Secondary | ICD-10-CM | POA: Insufficient documentation

## 2017-11-23 DIAGNOSIS — Z8371 Family history of colonic polyps: Secondary | ICD-10-CM | POA: Insufficient documentation

## 2017-11-23 DIAGNOSIS — J45909 Unspecified asthma, uncomplicated: Secondary | ICD-10-CM | POA: Insufficient documentation

## 2017-11-23 DIAGNOSIS — R05 Cough: Secondary | ICD-10-CM | POA: Insufficient documentation

## 2017-11-23 MED ORDER — ALPRAZOLAM 1 MG PO TABS: ORAL_TABLET | ORAL | 0 refills | Status: DC

## 2017-11-23 NOTE — Progress Notes (Signed)
Pt is in the office today for medication for her MRI  Pt had an MRI schedule for 1/22 pt states she had to cancel  Pt states if she can not be knocked out she does not want it. Pt states she wants to be completely knocked out

## 2017-11-23 NOTE — Progress Notes (Signed)
Patient ID: Jasmine Martin, female    DOB: 08-Jun-1961  MRN: [id number]  CC: Medication Management (MRI)   Subjective: Jasmine Martin is a 57 y.o. female who presents for UC visit.  Mother is with her. Her concerns today include:  Pt with hx of plantar fasciitis, GERD, eosinophilic esophagitis, obesity, upper airway cough syndrome  Patient scheduled for MRI of her right shoulder later this month.  She is requesting "something to knock me out." -Reports having had an MRI in the past that was a very unpleasant experience for her. Noise from metals knocking and being closed in really bothered her Patient Active Problem List   Diagnosis Date Noted  . Upper airway cough syndrome 05/13/2016  . Eosinophilic esophagitis 23/76/2831  . Elevated BP 03/29/2016  . History of asthma 03/29/2016  . Seasonal allergies 02/02/2016  . GERD (gastroesophageal reflux disease) 01/11/2016  . Severe obesity (BMI >= 40) ([place]) 01/11/2016     Current Outpatient Medications on File Prior to Visit  Medication Sig Dispense Refill  . albuterol (PROVENTIL HFA;VENTOLIN HFA) 108 (90 Base) MCG/ACT inhaler Inhale 2 puffs into the lungs every 6 (six) hours as needed for wheezing or shortness of breath. 8 g 3  . cetirizine (ZYRTEC) 10 MG tablet Take 1 tablet (10 mg total) by mouth daily. 30 tablet 5  . cyclobenzaprine (FLEXERIL) 10 MG tablet TAKE 1 TABLET BY MOUTH 3 TIMES DAILY AS NEEDED FOR MUSCLE SPASMS. 15 tablet 0  . famotidine (PEPCID) 20 MG tablet One at bedtime    . ibuprofen (ADVIL,MOTRIN) 800 MG tablet Take 1 tablet (800 mg total) by mouth every 8 (eight) hours as needed. 60 tablet 2  . Multiple Vitamins-Minerals (MULTIVITAMIN WITH MINERALS) tablet Take 1 tablet by mouth daily. Reported on 01/01/2016    . pantoprazole (PROTONIX) 40 MG tablet Take 1 tablet (40 mg total) by mouth daily. 30 tablet 5   No current facility-administered medications on file prior to visit.     Allergies  Allergen Reactions    . Latex Itching and Rash    Social History   Socioeconomic History  . Marital status: Single    Spouse name: Not on file  . Number of children: 0  . Years of education: Not on file  . Highest education level: Not on file  Social Needs  . Financial resource strain: Not on file  . Food insecurity - worry: Not on file  . Food insecurity - inability: Not on file  . Transportation needs - medical: Not on file  . Transportation needs - non-medical: Not on file  Occupational History  . Occupation: Unemployed  Tobacco Use  . Smoking status: Never Smoker  . Smokeless tobacco: Never Used  Substance and Sexual Activity  . Alcohol use: No    Alcohol/week: 0.0 oz  . Drug use: No  . Sexual activity: Not Currently  Other Topics Concern  . Not on file  Social History Narrative  . Not on file    Family History  Problem Relation Age of Onset  . Liver cancer Father   . Hypertension Maternal Aunt   . Colon polyps Mother   . Diabetes Maternal Grandfather   . Diabetes Maternal Aunt   . Esophageal cancer Paternal Grandfather   . Colon cancer Neg Hx   . Kidney disease Neg Hx     Past Surgical History:  Procedure Laterality Date  . ABDOMINAL HYSTERECTOMY    . CHOLECYSTECTOMY      ROS: Review of  Systems As above PHYSICAL EXAM: BP (!) 147/91   Pulse 79   Temp 98.6 F (37 C) (Oral)   Resp 16   Wt 291 lb 12.8 oz (132.4 kg)   SpO2 97%   BMI 47.10 kg/m   Repeat 130/90 Physical Exam General appearance - alert, well appearing, and in no distress Mental status -pt got very upset in talking about her past MRI experience and   ASSESSMENT AND PLAN: 1. Claustrophobia 2. Chronic right shoulder pain Patient given Xanax 1 mg to take half hour to an hour before the MRI.  Informed that if it does not take the edge off she can repeat the dose.  Patient told that the medicine should relax her but I do not want to give it to larger doses to suppress her breathing.  Advised that someone  drive her to and from the MRI facility  3. Elevated blood pressure reading Probably due to pt getting upset Advise DASH diet nonetheless and try to check BP once a wk  Patient was given the opportunity to ask questions.  Patient verbalized understanding of the plan and was able to repeat key elements of the plan.   No orders of the defined types were placed in this encounter.    Requested Prescriptions    No prescriptions requested or ordered in this encounter    No Follow-up on file.  Karle Plumber, MD, FACP

## 2017-11-23 NOTE — Patient Instructions (Addendum)
Take 1 mg Xanax 1/2 hour to 1 hour prior to your MRI.  If needed you can repeat dose one time.  Make sure you have a designated driver to and from the MRI facility.  Try to limit salt in your foods.  Try to check your blood pressure at least once a week and bring in readings on next visit.

## 2017-11-25 ENCOUNTER — Ambulatory Visit (HOSPITAL_COMMUNITY): Payer: Self-pay | Attending: Internal Medicine

## 2017-11-28 ENCOUNTER — Ambulatory Visit (HOSPITAL_COMMUNITY): Payer: Self-pay

## 2017-12-01 ENCOUNTER — Ambulatory Visit: Payer: Self-pay

## 2017-12-01 ENCOUNTER — Encounter: Payer: Self-pay | Admitting: Podiatry

## 2017-12-01 ENCOUNTER — Ambulatory Visit: Payer: No Typology Code available for payment source | Admitting: Podiatry

## 2017-12-01 VITALS — BP 126/79 | HR 78 | Ht 67.5 in | Wt 291.0 lb

## 2017-12-01 DIAGNOSIS — M79671 Pain in right foot: Secondary | ICD-10-CM

## 2017-12-01 DIAGNOSIS — M722 Plantar fascial fibromatosis: Secondary | ICD-10-CM

## 2017-12-01 MED ORDER — MELOXICAM 15 MG PO TABS: 15.0000 mg | ORAL_TABLET | Freq: Every day | ORAL | 0 refills | Status: DC

## 2017-12-01 MED FILL — MELOXICAM 15 MG TABLET: 15 | 30 days supply | Qty: 30 | Fill #0

## 2017-12-01 NOTE — Progress Notes (Signed)
  Subjective:  Patient ID: Smith Mince, female    DOB: [birth year],  MRN: [id number]  Chief Complaint  Patient presents with  . Foot Pain    fell down a few stairs just before Christmas. Sharp heel pain, feels like a nail driving into her heel   57 y.o. female presents for heel pain.  Ports that she fell downstairs just before Christmas reports sharp heel pain.  States that she feels like a nail driving to her right heel.  Past Medical History:  Diagnosis Date  . Asthma    Past Surgical History:  Procedure Laterality Date  . ABDOMINAL HYSTERECTOMY    . CHOLECYSTECTOMY      Current Outpatient Medications:  .  albuterol (PROVENTIL HFA;VENTOLIN HFA) 108 (90 Base) MCG/ACT inhaler, Inhale 2 puffs into the lungs every 6 (six) hours as needed for wheezing or shortness of breath., Disp: 8 g, Rfl: 3 .  ALPRAZolam (XANAX) 1 MG tablet, Take 1 tab PO 1/2-1 hr prior to MRI to help relax.  Can repeat x 1 if needed., Disp: 2 tablet, Rfl: 0 .  cetirizine (ZYRTEC) 10 MG tablet, Take 1 tablet (10 mg total) by mouth daily., Disp: 30 tablet, Rfl: 5 .  cyclobenzaprine (FLEXERIL) 10 MG tablet, TAKE 1 TABLET BY MOUTH 3 TIMES DAILY AS NEEDED FOR MUSCLE SPASMS., Disp: 15 tablet, Rfl: 0 .  famotidine (PEPCID) 20 MG tablet, One at bedtime, Disp: , Rfl:  .  ibuprofen (ADVIL,MOTRIN) 800 MG tablet, Take 1 tablet (800 mg total) by mouth every 8 (eight) hours as needed., Disp: 60 tablet, Rfl: 2 .  meloxicam (MOBIC) 15 MG tablet, Take 1 tablet (15 mg total) by mouth daily., Disp: 30 tablet, Rfl: 0 .  Multiple Vitamins-Minerals (MULTIVITAMIN WITH MINERALS) tablet, Take 1 tablet by mouth daily. Reported on 01/01/2016, Disp: , Rfl:  .  pantoprazole (PROTONIX) 40 MG tablet, Take 1 tablet (40 mg total) by mouth daily., Disp: 30 tablet, Rfl: 5  Allergies  Allergen Reactions  . Latex Itching and Rash     Objective:   Vitals:   12/01/17 1518  BP: 126/79  Pulse: 78   General AA&O x3. Normal mood and affect.    Vascular Pedal pulses palpable.  Neurologic Epicritic sensation grossly intact.  Dermatologic No open lesions. Skin normal texture and turgor.  Orthopedic: Pain to palpation medial calcaneal tuber right   Radiographs taken reviewed spurring noted no acute fractures dislocations Assessment & Plan:  Patient was evaluated and treated and all questions answered.   Plantar Fasciitis, right - XR reviewed as above.  - Educated on icing and stretching. Instructions given.  - Injection delivered to the plantar fascia as below. - Night splint dispensed.  Procedure: Injection Tendon/Ligament Location: Right plantar fascia at the glabrous junction; medial approach. Skin Prep: Alcohol. Injectate: 1 cc 0.5% marcaine plain, 1 cc dexamethasone phosphate, 0.5 cc kenalog 10. Disposition: Patient tolerated procedure well. Injection site dressed with a band-aid.  Return if symptoms worsen or fail to improve.

## 2017-12-01 NOTE — Patient Instructions (Signed)

## 2017-12-06 ENCOUNTER — Ambulatory Visit (HOSPITAL_COMMUNITY)
Admission: EM | Admit: 2017-12-06 | Discharge: 2017-12-06 | Disposition: A | Discharge status: Home / Self Care | Payer: Self-pay | Attending: Family Medicine | Admitting: Family Medicine

## 2017-12-06 ENCOUNTER — Other Ambulatory Visit: Payer: Self-pay

## 2017-12-06 ENCOUNTER — Encounter (HOSPITAL_COMMUNITY): Payer: Self-pay | Admitting: Emergency Medicine

## 2017-12-06 DIAGNOSIS — J45909 Unspecified asthma, uncomplicated: Secondary | ICD-10-CM | POA: Insufficient documentation

## 2017-12-06 DIAGNOSIS — Z560 Unemployment, unspecified: Secondary | ICD-10-CM | POA: Insufficient documentation

## 2017-12-06 DIAGNOSIS — R112 Nausea with vomiting, unspecified: Secondary | ICD-10-CM | POA: Insufficient documentation

## 2017-12-06 DIAGNOSIS — R197 Diarrhea, unspecified: Secondary | ICD-10-CM | POA: Insufficient documentation

## 2017-12-06 DIAGNOSIS — Z8371 Family history of colonic polyps: Secondary | ICD-10-CM | POA: Insufficient documentation

## 2017-12-06 LAB — GASTROINTESTINAL PANEL BY PCR, STOOL (REPLACES STOOL CULTURE)

## 2017-12-06 LAB — POCT I-STAT, CHEM 8
BUN: 10 mg/dL (ref 6–20)
CHLORIDE: 99 mmol/L — AB (ref 101–111)
Calcium, Ion: 1.15 mmol/L (ref 1.15–1.40)
Creatinine, Ser: 0.6 mg/dL (ref 0.44–1.00)
GLUCOSE: 105 mg/dL — AB (ref 65–99)
HCT: 53 % — ABNORMAL HIGH (ref 36.0–46.0)
Hemoglobin: 18 g/dL — ABNORMAL HIGH (ref 12.0–15.0)
POTASSIUM: 3.3 mmol/L — AB (ref 3.5–5.1)
Sodium: 140 mmol/L (ref 135–145)
TCO2: 30 mmol/L (ref 22–32)

## 2017-12-06 MED ORDER — ONDANSETRON 8 MG PO TBDP: 8.0000 mg | ORAL_TABLET | Freq: Three times a day (TID) | ORAL | 0 refills | Status: DC | PRN

## 2017-12-06 MED FILL — ONDANSETRON ODT 8 MG TABLET: 8 | 4 days supply | Qty: 12 | Fill #0

## 2017-12-06 NOTE — ED Provider Notes (Signed)
[facility]   [id number] 12/06/17 Arrival Time: 1022   SUBJECTIVE:  Jasmine Martin is a 57 y.o. female who presents to the urgent care with complaint of vomiting, diarrhea.  Symptoms began after she ate some oranges that she had bought at [employer].  She suspects these were contaminated with Listeria.  Symptoms began yesterday and have continued right through the night.  Her mother is also sick with vomiting and diarrhea.  Past Medical History:  Diagnosis Date  . Asthma    Family History  Problem Relation Age of Onset  . Liver cancer Father   . Hypertension Maternal Aunt   . Colon polyps Mother   . Diabetes Maternal Grandfather   . Diabetes Maternal Aunt   . Esophageal cancer Paternal Grandfather   . Colon cancer Neg Hx   . Kidney disease Neg Hx    Social History   Socioeconomic History  . Marital status: Single    Spouse name: Not on file  . Number of children: 0  . Years of education: Not on file  . Highest education level: Not on file  Social Needs  . Financial resource strain: Not on file  . Food insecurity - worry: Not on file  . Food insecurity - inability: Not on file  . Transportation needs - medical: Not on file  . Transportation needs - non-medical: Not on file  Occupational History  . Occupation: Unemployed  Tobacco Use  . Smoking status: Never Smoker  . Smokeless tobacco: Never Used  Substance and Sexual Activity  . Alcohol use: No    Alcohol/week: 0.0 oz  . Drug use: No  . Sexual activity: Not Currently  Other Topics Concern  . Not on file  Social History Narrative  . Not on file   No outpatient medications have been marked as taking for the 12/06/17 encounter [facility] Encounter).   Allergies  Allergen Reactions  . Latex Itching and Rash      ROS: As per HPI, remainder of ROS negative.   OBJECTIVE:   Vitals:   12/06/17 1128  BP: 135/75  Pulse: 85  Resp: (!) 22  Temp: 98.7 F (37.1 C)  TempSrc: Oral  SpO2: 96%     General appearance: alert; no distress Eyes: PERRL; EOMI; conjunctiva normal HENT: normocephalic; atraumatic;  oral mucosa normal Neck: supple Lungs: clear to auscultation bilaterally Heart: regular rate and rhythm Abdomen: soft, non-tender; bowel sounds normal; no masses or organomegaly; no guarding or rebound tenderness Back: no CVA tenderness Extremities: no cyanosis or edema; symmetrical with no gross deformities Skin: warm and dry Neurologic: normal gait; grossly normal Psychological: alert and cooperative; normal mood and affect    Labs:  Results for orders placed or performed in visit on 04/14/17  TSH  Result Value Ref Range   TSH 2.920 0.450 - 4.500 uIU/mL  Vitamin D, 25-hydroxy  Result Value Ref Range   Vit D, 25-Hydroxy 39.6 30.0 - 100.0 ng/mL  Basic metabolic panel  Result Value Ref Range   Glucose 88 65 - 99 mg/dL   BUN 11 6 - 24 mg/dL   Creatinine, Ser 0.66 0.57 - 1.00 mg/dL   GFR calc non Af Amer 100 >59 mL/min/1.73   GFR calc Af Amer 115 >59 mL/min/1.73   BUN/Creatinine Ratio 17 9 - 23   Sodium 144 134 - 144 mmol/L   Potassium 4.4 3.5 - 5.2 mmol/L   Chloride 101 96 - 106 mmol/L   CO2 28 18 - 29 mmol/L  Calcium 9.8 8.7 - 10.2 mg/dL  HgB A1c  Result Value Ref Range   Hemoglobin A1C 5.6   Glucose (CBG)  Result Value Ref Range   POC Glucose 96 70 - 99 mg/dl    Labs Reviewed  GASTROINTESTINAL PANEL BY PCR, STOOL (REPLACES STOOL CULTURE)    No results found.     ASSESSMENT & PLAN:  1. Nausea vomiting and diarrhea     Meds ordered this encounter  Medications  . ondansetron (ZOFRAN-ODT) 8 MG disintegrating tablet    Sig: Take 1 tablet (8 mg total) by mouth every 8 (eight) hours as needed for nausea.    Dispense:  12 tablet    Refill:  0    Reviewed expectations re: course of current medical issues. Questions answered. Outlined signs and symptoms indicating need for more acute intervention. Patient verbalized understanding. After Visit  Summary given.       Robyn Haber, MD 12/06/17 1242

## 2017-12-06 NOTE — ED Triage Notes (Signed)
Onset of symptoms yesterday evening.  Patient has had vomiting, diarrhea.

## 2017-12-06 NOTE — Discharge Instructions (Signed)
We are running a test to check for Listeria .  For adults that have contracted listeria, no antibiotics are usually needed.  He usually lasts a day or so.  In the meantime we have given you medicine to help control the symptoms and suggest that you take sips of clear liquid as your nausea subsides.

## 2018-06-01 ENCOUNTER — Emergency Department (HOSPITAL_COMMUNITY): Payer: Self-pay

## 2018-06-01 ENCOUNTER — Emergency Department (HOSPITAL_COMMUNITY)
Admission: EM | Admit: 2018-06-01 | Discharge: 2018-06-01 | Disposition: A | Discharge status: Home / Self Care | Payer: Self-pay | Attending: Emergency Medicine | Admitting: Emergency Medicine

## 2018-06-01 ENCOUNTER — Other Ambulatory Visit: Payer: Self-pay

## 2018-06-01 ENCOUNTER — Encounter (HOSPITAL_COMMUNITY): Payer: Self-pay | Admitting: Emergency Medicine

## 2018-06-01 DIAGNOSIS — J45909 Unspecified asthma, uncomplicated: Secondary | ICD-10-CM | POA: Insufficient documentation

## 2018-06-01 DIAGNOSIS — R51 Headache: Secondary | ICD-10-CM | POA: Insufficient documentation

## 2018-06-01 DIAGNOSIS — Z79899 Other long term (current) drug therapy: Secondary | ICD-10-CM | POA: Insufficient documentation

## 2018-06-01 DIAGNOSIS — W19XXXA Unspecified fall, initial encounter: Secondary | ICD-10-CM

## 2018-06-01 MED ORDER — OXYCODONE-ACETAMINOPHEN 5-325 MG PO TABS
1.0000 | ORAL_TABLET | Freq: Once | ORAL | Status: AC
  Administered 2018-06-01: 1 via ORAL
  Filled 2018-06-01: qty 1

## 2018-06-01 MED ORDER — NAPROXEN 375 MG PO TABS: 375.0000 mg | ORAL_TABLET | Freq: Two times a day (BID) | ORAL | 0 refills | Status: DC

## 2018-06-01 MED ORDER — OXYCODONE-ACETAMINOPHEN 5-325 MG PO TABS
1.0000 | ORAL_TABLET | ORAL | Status: DC | PRN
  Administered 2018-06-01: 1 via ORAL
  Filled 2018-06-01: qty 1

## 2018-06-01 MED ORDER — TRAMADOL HCL 50 MG PO TABS: 50.0000 mg | ORAL_TABLET | Freq: Four times a day (QID) | ORAL | 0 refills | Status: DC | PRN

## 2018-06-01 NOTE — ED Notes (Signed)
Knee splint placed to Rt. knee

## 2018-06-01 NOTE — ED Notes (Signed)
Pt ready for discharge VS documented 

## 2018-06-01 NOTE — ED Triage Notes (Signed)
Patient's legs were tangled in a chair at a hair [place] and she tripped and fell when trying to stand. Complains of right knee and right shoulder pain. Patient denies LOC.

## 2018-06-01 NOTE — ED Notes (Signed)
Pt ambulate few steps in room while wearing knee splint

## 2018-06-01 NOTE — ED Provider Notes (Signed)
[facility] Provider Note   CSN: [id number] Arrival date & time: 06/01/18  1411     History   Chief Complaint Chief Complaint  Patient presents with  . Fall    HPI Jasmine Martin is a 57 y.o. female with a hx of asthma, abdominal hysterectomy,  and obesity who presents to the ED s/p mechanical fall with complaints of pain in multiple areas shortly prior to arrival. Patient states that she was getting out of a chair at the hair salon and her legs got stuck in the metal bar attached to the chair causing her to fall forward onto her R knee and onto an outstretched R hand. She did have a head injury, no LOC. She was able to get up on her own after staying down for a little while. She is having pain to her head, neck, upper back, entire RUE, L hand, and RLE from the knee distally. Pain is a 10/10, somewhat relieved by percocet in triage. She had some tingling in the R hand which is now resolving. Denies numbness, weakness, or dizziness. Denies prodrome of chest pain, dyspnea, visual disturbance, lightheadedness, or dizziness. Her mother is with her at bedside.   HPI  Past Medical History:  Diagnosis Date  . Asthma     Patient Active Problem List   Diagnosis Date Noted  . Upper airway cough syndrome 05/13/2016  . Eosinophilic esophagitis 47/82/9562  . Elevated BP 03/29/2016  . History of asthma 03/29/2016  . Seasonal allergies 02/02/2016  . GERD (gastroesophageal reflux disease) 01/11/2016  . Severe obesity (BMI >= 40) ([place]) 01/11/2016    Past Surgical History:  Procedure Laterality Date  . ABDOMINAL HYSTERECTOMY    . CHOLECYSTECTOMY       OB History    Gravida  0   Para  0   Term  0   Preterm  0   AB  0   Living  0     SAB  0   TAB  0   Ectopic  0   Multiple  0   Live Births  0            Home Medications    Prior to Admission medications   Medication Sig Start Date End Date Taking? Authorizing Provider    albuterol (PROVENTIL HFA;VENTOLIN HFA) 108 (90 Base) MCG/ACT inhaler Inhale 2 puffs into the lungs every 6 (six) hours as needed for wheezing or shortness of breath. 03/29/16   Funches, Adriana Mccallum, MD  ALPRAZolam (XANAX) 1 MG tablet Take 1 tab PO 1/2-1 hr prior to MRI to help relax.  Can repeat x 1 if needed. 11/23/17   Ladell Pier, MD  cetirizine (ZYRTEC) 10 MG tablet Take 1 tablet (10 mg total) by mouth daily. 02/02/16   Funches, Adriana Mccallum, MD  cyclobenzaprine (FLEXERIL) 10 MG tablet TAKE 1 TABLET BY MOUTH 3 TIMES DAILY AS NEEDED FOR MUSCLE SPASMS. 05/17/16   Boykin Nearing, MD  famotidine (PEPCID) 20 MG tablet One at bedtime 05/13/16   Tanda Rockers, MD  ibuprofen (ADVIL,MOTRIN) 800 MG tablet Take 1 tablet (800 mg total) by mouth every 8 (eight) hours as needed. 10/09/17   Ladell Pier, MD  meloxicam (MOBIC) 15 MG tablet Take 1 tablet (15 mg total) by mouth daily. 12/01/17   Evelina Bucy, DPM  Multiple Vitamins-Minerals (MULTIVITAMIN WITH MINERALS) tablet Take 1 tablet by mouth daily. Reported on 01/01/2016    [provider]  ondansetron (ZOFRAN-ODT)  8 MG disintegrating tablet Take 1 tablet (8 mg total) by mouth every 8 (eight) hours as needed for nausea. 12/06/17   Robyn Haber, MD  pantoprazole (PROTONIX) 40 MG tablet Take 1 tablet (40 mg total) by mouth daily. 02/02/16   Boykin Nearing, MD    Family History Family History  Problem Relation Age of Onset  . Liver cancer Father   . Hypertension Maternal Aunt   . Colon polyps Mother   . Diabetes Maternal Grandfather   . Diabetes Maternal Aunt   . Esophageal cancer Paternal Grandfather   . Colon cancer Neg Hx   . Kidney disease Neg Hx     Social History Social History   Tobacco Use  . Smoking status: Never Smoker  . Smokeless tobacco: Never Used  Substance Use Topics  . Alcohol use: No    Alcohol/week: 0.0 oz  . Drug use: No     Allergies   Latex   Review of Systems Review of Systems   Constitutional: Negative for chills and fever.  Eyes: Negative for visual disturbance.  Respiratory: Negative for shortness of breath.   Cardiovascular: Negative for chest pain.  Gastrointestinal: Negative for nausea and vomiting.  Musculoskeletal: Positive for arthralgias, back pain, myalgias and neck pain.  Neurological: Positive for headaches. Negative for dizziness, syncope, speech difficulty, weakness and numbness.       Positive for paresthesias to the R hand, resolved at present.      Physical Exam Updated Vital Signs BP (!) 109/48 (BP Location: Left Arm)   Pulse 86   Temp 98 F (36.7 C) (Oral)   Resp 18   SpO2 96%   Physical Exam  Constitutional: She appears well-developed and well-nourished.  Non-toxic appearance. No distress.  HENT:  Head: Normocephalic and atraumatic. Head is without raccoon's eyes, without Battle's sign and without contusion.  Right Ear: No drainage. No hemotympanum.  Left Ear: No drainage. No hemotympanum.  Nose: No rhinorrhea.  Mouth/Throat: Uvula is midline and oropharynx is clear and moist.  No palpable bony tenderness. No appreciable intra-oral injuries.   Eyes: Pupils are equal, round, and reactive to light. Conjunctivae and EOM are normal. Right eye exhibits no discharge. Left eye exhibits no discharge.  Neck: Spinous process tenderness (diffuse nonfocal) and muscular tenderness (R sided) present. Normal range of motion present.  Cardiovascular: Normal rate and regular rhythm.  No murmur heard. Pulses:      Radial pulses are 2+ on the right side, and 2+ on the left side.       Dorsalis pedis pulses are 2+ on the right side, and 2+ on the left side.       Posterior tibial pulses are 2+ on the right side, and 2+ on the left side.  Pulmonary/Chest: Breath sounds normal. No respiratory distress. She has no wheezes. She has no rhonchi. She has no rales.  No bruising to chest or abdomen.   Abdominal: Soft. She exhibits no distension. There is no  tenderness.  Musculoskeletal:  There is bruising to the R knee and the proximal portion of the lower leg. There is some soft tissue swelling in this area, otherwise no bruising/swelling. No obvious deformities, open wounds, erythema, or increased warmth. Compartments are soft.  Upper extremities: Patient has normal ROM at all joints with the exception of some limitation in R shoulder flexion, can flex past 90 degrees, unable to get to 180 secondary to pain. RUE; Patient diffusely tender to the R shoulder and humerus, there does not  appear to be focal tenderness worse in a specific location. No tenderness at the elbow including nontender olecranon, radial head, or medial/lateral epicondyle. Hand and wrist are also nontender. No snuff box tenderness to palpation. LUE: mild tenderness over the L thumb, no snuffbox tenderness. NVI distally.  Back: Diffuse upper thoracic tenderness, non focal, no lumbar tenderness. No step off or palpable crepitus.  Lower extremities: Normal ROM to bilateral hips and ankles. Normal ROM to L knee. R knee limitation in full flexion/extension, can perform each of these motions, can flex to 90 degrees and majorily extend. She is diffusely tender to the R knee and the proximal lower leg, non focal. She is tender over the medial/lateral ankle, non focal, tender over the dorsum of the mid foot, nontender at navicular bone or base of the 5th. NVI distally.   Neurological:  Alert. Clear speech. CN III-XII grossly intact. Negative pronator drift. 5/5 symmetric grip strength. 5/5 strength with plantar/dorsiflexion bilaterally.   Skin: Skin is warm and dry. No rash noted.  Psychiatric: She has a normal mood and affect. Her behavior is normal.  Nursing note and vitals reviewed.    ED Treatments / Results  Labs (all labs ordered are listed, but only abnormal results are displayed) Labs Reviewed - No data to display  EKG None  Radiology Dg Thoracic Spine 2 View  Result Date:  06/01/2018 CLINICAL DATA:  Fall getting out of chair.  Initial encounter. EXAM: THORACIC SPINE 2 VIEWS COMPARISON:  Lateral chest x-ray 01/11/2016 FINDINGS: Negative for fracture or traumatic malalignment. Minimal upper thoracic dextrocurvature. Generalized spondylosis and disc narrowing. IMPRESSION: No evidence of fracture. Electronically Signed   By: Monte Fantasia M.D.   On: 06/01/2018 19:42   Dg Shoulder Right  Result Date: 06/01/2018 CLINICAL DATA:  Pain after fall EXAM: RIGHT SHOULDER - 2+ VIEW COMPARISON:  August 08, 2017 FINDINGS: Calcification adjacent to the lateral humeral head is stable, consistent with calcific tendinosis. The appearance of the medial humeral head is somewhat unusual but is not appreciated on the humeral films from today and may be positional. No convincing evidence of an acute fracture. No dislocation. IMPRESSION: 1. The contour of the medial humeral head is mildly irregular. This was not appreciated on today's humeral images and the finding is likely either positional or due to remote trauma. No convincing evidence of acute fracture. Recommend clinical correlation. Electronically Signed   By: Dorise Bullion III M.D   On: 06/01/2018 15:35   Dg Wrist Complete Right  Result Date: 06/01/2018 CLINICAL DATA:  Pain after fall EXAM: RIGHT WRIST - COMPLETE 3+ VIEW COMPARISON:  None. FINDINGS: There is no evidence of fracture or dislocation. There is no evidence of arthropathy or other focal bone abnormality. Soft tissues are unremarkable. IMPRESSION: Negative. Electronically Signed   By: Dorise Bullion III M.D   On: 06/01/2018 15:37   Dg Tibia/fibula Right  Result Date: 06/01/2018 CLINICAL DATA:  Fall with leg pain EXAM: RIGHT TIBIA AND FIBULA - 2 VIEW COMPARISON:  None. FINDINGS: Mild patellofemoral degenerative changes. No acute displaced fracture or malalignment. Diffuse soft tissue edema. IMPRESSION: Diffuse soft tissue swelling.  No acute osseous abnormality. Electronically  Signed   By: Donavan Foil M.D.   On: 06/01/2018 19:41   Ct Head Wo Contrast  Result Date: 06/01/2018 CLINICAL DATA:  57 year old female with acute head and neck injury following fall today. Initial encounter. EXAM: CT HEAD WITHOUT CONTRAST CT CERVICAL SPINE WITHOUT CONTRAST TECHNIQUE: Multidetector CT imaging of the head  and cervical spine was performed following the standard protocol without intravenous contrast. Multiplanar CT image reconstructions of the cervical spine were also generated. COMPARISON:  10/24/2011 CT FINDINGS: CT HEAD FINDINGS Brain: No evidence of acute infarction, hemorrhage, hydrocephalus, extra-axial collection or mass lesion/mass effect. Mild atrophy and chronic small-vessel white matter ischemic changes again noted. Vascular: No hyperdense vessel or unexpected calcification. Skull: Normal. Negative for fracture or focal lesion. Sinuses/Orbits: No acute finding. Other: None. CT CERVICAL SPINE FINDINGS Alignment: Normal. Skull base and vertebrae: No acute fracture. No primary bone lesion or focal pathologic process. Soft tissues and spinal canal: No prevertebral fluid or swelling. No visible canal hematoma. Disc levels: Mild multilevel degenerative disc disease and mild to moderate multilevel facet arthropathy again noted. Moderate degenerative disc disease/spondylosis at C5-6 again identified. Upper chest: No acute abnormality Other: None IMPRESSION: 1. No evidence of acute intracranial abnormality. Atrophy and chronic small-vessel white matter ischemic changes. 2. No static evidence of acute injury to the cervical spine. Degenerative changes as described. Electronically Signed   By: Margarette Canada M.D.   On: 06/01/2018 19:13   Ct Cervical Spine Wo Contrast  Result Date: 06/01/2018 CLINICAL DATA:  57 year old female with acute head and neck injury following fall today. Initial encounter. EXAM: CT HEAD WITHOUT CONTRAST CT CERVICAL SPINE WITHOUT CONTRAST TECHNIQUE: Multidetector CT  imaging of the head and cervical spine was performed following the standard protocol without intravenous contrast. Multiplanar CT image reconstructions of the cervical spine were also generated. COMPARISON:  10/24/2011 CT FINDINGS: CT HEAD FINDINGS Brain: No evidence of acute infarction, hemorrhage, hydrocephalus, extra-axial collection or mass lesion/mass effect. Mild atrophy and chronic small-vessel white matter ischemic changes again noted. Vascular: No hyperdense vessel or unexpected calcification. Skull: Normal. Negative for fracture or focal lesion. Sinuses/Orbits: No acute finding. Other: None. CT CERVICAL SPINE FINDINGS Alignment: Normal. Skull base and vertebrae: No acute fracture. No primary bone lesion or focal pathologic process. Soft tissues and spinal canal: No prevertebral fluid or swelling. No visible canal hematoma. Disc levels: Mild multilevel degenerative disc disease and mild to moderate multilevel facet arthropathy again noted. Moderate degenerative disc disease/spondylosis at C5-6 again identified. Upper chest: No acute abnormality Other: None IMPRESSION: 1. No evidence of acute intracranial abnormality. Atrophy and chronic small-vessel white matter ischemic changes. 2. No static evidence of acute injury to the cervical spine. Degenerative changes as described. Electronically Signed   By: Margarette Canada M.D.   On: 06/01/2018 19:13   Dg Knee Complete 4 Views Right  Result Date: 06/01/2018 CLINICAL DATA:  Pain after fall EXAM: RIGHT KNEE - COMPLETE 4+ VIEW COMPARISON:  None. FINDINGS: No evidence of fracture, dislocation, or joint effusion. No evidence of arthropathy or other focal bone abnormality. Soft tissues are unremarkable. IMPRESSION: Negative. Electronically Signed   By: Dorise Bullion III M.D   On: 06/01/2018 15:39   Dg Humerus Right  Result Date: 06/01/2018 CLINICAL DATA:  Pain after trauma EXAM: RIGHT HUMERUS - 2+ VIEW COMPARISON:  None. FINDINGS: Calcifications adjacent to the  lateral humeral head consistent with calcific tendinosis. No acute fracture identified. IMPRESSION: Negative. Electronically Signed   By: Dorise Bullion III M.D   On: 06/01/2018 15:36   Dg Hand Complete Left  Result Date: 06/01/2018 CLINICAL DATA:  Fall with left hand pain fourth and fifth MCP joint EXAM: LEFT HAND - COMPLETE 3+ VIEW COMPARISON:  None. FINDINGS: No fracture or malalignment. No radiopaque foreign body in the soft tissues. IMPRESSION: No acute osseous abnormality. Electronically Signed  By: Donavan Foil M.D.   On: 06/01/2018 19:40   Dg Hand Complete Right  Result Date: 06/01/2018 CLINICAL DATA:  Pain after fall EXAM: RIGHT HAND - COMPLETE 3+ VIEW COMPARISON:  None. FINDINGS: There is no evidence of fracture or dislocation. There is no evidence of arthropathy or other focal bone abnormality. Soft tissues are unremarkable. IMPRESSION: Negative. Electronically Signed   By: Dorise Bullion III M.D   On: 06/01/2018 15:40   Dg Foot 2 Views Left  Result Date: 06/01/2018 CLINICAL DATA:  Heel pain.  Evaluate for bone spurs EXAM: LEFT FOOT - 2 VIEW COMPARISON:  08/08/2017 FINDINGS: Non eroded heel spur, progressed from prior. No evidence of fracture or stress reaction. Os navicularis. No fracture deformity or dislocation. IMPRESSION: Plantar heel spur that has enlarged from 2018. Electronically Signed   By: Monte Fantasia M.D.   On: 06/01/2018 19:36   Dg Foot Complete Right  Result Date: 06/01/2018 CLINICAL DATA:  Fall with foot pain EXAM: RIGHT FOOT COMPLETE - 3+ VIEW COMPARISON:  12/01/2017 FINDINGS: No acute displaced fracture or malalignment. Moderate plantar calcaneal spur. Mild bunion at the first metatarsal head. Ossicle adjacent to the navicular bone. IMPRESSION: No acute osseous abnormality Electronically Signed   By: Donavan Foil M.D.   On: 06/01/2018 19:43    Procedures Procedures (including critical care time) .Splint Application Date/Time: 6/64/4034 8:20 PM Performed by:  EDT Authorized by: Amaryllis Dyke, PA-C   Consent:    Consent obtained:  Verbal   Consent given by:  Patient   Risks discussed:  Discoloration, numbness, pain and swelling   Alternatives discussed:  No treatment Pre-procedure details:    Sensation:  Normal Procedure details:    Laterality:  Right   Location:  Knee   Splint type:  Knee immobilizer Post-procedure details:    Sensation:  Normal   Patient tolerance of procedure:  Tolerated well, no immediate complications   Medications Ordered in ED Medications  oxyCODONE-acetaminophen (PERCOCET/ROXICET) 5-325 MG per tablet 1 tablet (1 tablet Oral Given 06/01/18 1423)  oxyCODONE-acetaminophen (PERCOCET/ROXICET) 5-325 MG per tablet 1 tablet (1 tablet Oral Given 06/01/18 2026)     Initial Impression / Assessment and Plan / ED Course  I have reviewed the triage vital signs and the nursing notes.  Pertinent labs & imaging results that were available during my care of the patient were reviewed by me and considered in my medical decision making (see chart for details).   Patient presents to the emergency department status post mechanical fall with pain in multiple locations.  Exam as above, she is diffusely tender, there does not appear to be any point/focal bony areas of tenderness.  She has no focal neurologic deficits.  Imaging obtained per triage with additional imaging added on by myself- no evidence of acute intracranial abnormality, fractures, or dislocations-there was some concern for an irregularity to the medial humeral head on shoulder x-ray-no convincing evidence of acute fractures, dedicated humerus films are benign, she is not point focally tender in this area.   Patient hesitant to ambulate in the emergency department, given Percocet and placed in a knee immobilizer with improvement, following these interventions she was able to ambulate a few steps and then asked to stop secondary to pain in her right knee, however she is  ambulatory.  Considered crutches, however patient is complaining of right upper extremity pain, therefore do not feel that these would be helpful.  We do not have walkers in the emergency department.  We  will send the patient home with prescription for pain medication (naproxen, tramadol) , [place] Controlled Substance reporting System queried, PRICE, with recommendations of purchasing a walker.  Explained to patient and her mother that [employer] is still open and sells these. Patient lives with her mother at home.  We will have her follow-up with PCP or orthopedics. I discussed results, treatment plan, need for PCP follow-up, and return precautions with the patient. Provided opportunity for questions, patient confirmed understanding and is in agreement with plan.    Final Clinical Impressions(s) / ED Diagnoses   Final diagnoses:  Fall, initial encounter    ED Discharge Orders        Ordered    naproxen (NAPROSYN) 375 MG tablet  2 times daily     06/01/18 2115    traMADol (ULTRAM) 50 MG tablet  Every 6 hours PRN     06/01/18 2115       Amaryllis Dyke, PA-C 06/01/18 2248    Pattricia Boss, MD 06/01/18 2321

## 2018-06-01 NOTE — Discharge Instructions (Addendum)
Please read and follow all provided instructions.  You have been seen today for a fall  Tests performed today include: Imaging performed today - does NOT show any broken bones or dislocations.  Vital signs. See below for your results today.   Home care instructions: -- *PRICE in the first 24-48 hours after injury: Protect (with brace, splint, sling), if given by your provider- your knee immobilizer Rest Ice- Do not apply ice pack directly to your skin, place towel or similar between your skin and ice/ice pack. Apply ice for 20 min, then remove for 40 min while awake Compression- Wear brace, elastic bandage, splint as directed by your provider Elevate affected extremity above the level of your heart when not walking around for the first 24-48 hours   Medications:  Naproxen is a nonsteroidal anti-inflammatory medication that will help with pain and swelling. Be sure to take this medication as prescribed with food, 1 pill every 12 hours,  It should be taken with food, as it can cause stomach upset, and more seriously, stomach bleeding. Do not take other nonsteroidal anti-inflammatory medications with this such as Advil, ibuprofen, Motrin, or Aleve.   Tramadol- this is a controlled substance medication-may take 1 tablet every 6 hours as needed for severe pain.  Do not drive or operate machinery when you take this medicine.  Not drink alcohol when taking this medicine.  We have prescribed you new medication(s) today. Discuss the medications prescribed today with your pharmacist as they can have adverse effects and interactions with your other medicines including over the counter and prescribed medications. Seek medical evaluation if you start to experience new or abnormal symptoms after taking one of these medicines, seek care immediately if you start to experience difficulty breathing, feeling of your throat closing, facial swelling, or rash as these could be indications of a more serious allergic  reaction  You may take Tylenol per over-the-counter dosing with each of these medications safely.  [facility] to [facility] and purchase a walker to help with walking around the house.  Follow-up instructions: Please follow-up with your primary care provider or the provided orthopedic physician (bone specialist) if you continue to have significant pain in 3 days. In this case you may have a more severe injury that requires further care.   Return instructions:  Please return if your toes or feet are numb or tingling, appear gray or blue, or you have severe pain (also elevate the leg and loosen splint or wrap if you were given one) Please return to the Emergency Department if you experience worsening symptoms.  Please return if you have any other emergent concerns. Additional Information:  Your vital signs today were: BP (!) 109/48 (BP Location: Left Arm)    Pulse 86    Temp 98 F (36.7 C) (Oral)    Resp 18    SpO2 96%  If your blood pressure (BP) was elevated above 135/85 this visit, please have this repeated by your doctor within one month. ---------------

## 2018-06-01 NOTE — ED Notes (Addendum)
Pt fell and reports pain to her right ankle, right knee, right elbow, and posterior neck. Pt denies LOC.

## 2018-06-04 ENCOUNTER — Telehealth: Payer: Self-pay | Admitting: Internal Medicine

## 2018-06-04 NOTE — Telephone Encounter (Signed)
Patients mother dropped off papers for food stamps

## 2018-06-05 ENCOUNTER — Ambulatory Visit: Payer: Self-pay | Attending: Internal Medicine | Admitting: Internal Medicine

## 2018-06-05 ENCOUNTER — Encounter: Payer: Self-pay | Admitting: Internal Medicine

## 2018-06-05 VITALS — BP 150/80 | HR 76 | Temp 98.3°F | Resp 16 | Wt 240.0 lb

## 2018-06-05 DIAGNOSIS — S8001XA Contusion of right knee, initial encounter: Secondary | ICD-10-CM | POA: Insufficient documentation

## 2018-06-05 DIAGNOSIS — Z6837 Body mass index (BMI) 37.0-37.9, adult: Secondary | ICD-10-CM | POA: Insufficient documentation

## 2018-06-05 DIAGNOSIS — T7840XA Allergy, unspecified, initial encounter: Secondary | ICD-10-CM | POA: Insufficient documentation

## 2018-06-05 DIAGNOSIS — R030 Elevated blood-pressure reading, without diagnosis of hypertension: Secondary | ICD-10-CM | POA: Insufficient documentation

## 2018-06-05 DIAGNOSIS — W19XXXA Unspecified fall, initial encounter: Secondary | ICD-10-CM | POA: Insufficient documentation

## 2018-06-05 DIAGNOSIS — K219 Gastro-esophageal reflux disease without esophagitis: Secondary | ICD-10-CM | POA: Insufficient documentation

## 2018-06-05 DIAGNOSIS — X58XXXA Exposure to other specified factors, initial encounter: Secondary | ICD-10-CM | POA: Insufficient documentation

## 2018-06-05 DIAGNOSIS — K208 Other esophagitis: Secondary | ICD-10-CM | POA: Insufficient documentation

## 2018-06-05 DIAGNOSIS — J45909 Unspecified asthma, uncomplicated: Secondary | ICD-10-CM | POA: Insufficient documentation

## 2018-06-05 DIAGNOSIS — M25511 Pain in right shoulder: Secondary | ICD-10-CM | POA: Insufficient documentation

## 2018-06-05 DIAGNOSIS — M25571 Pain in right ankle and joints of right foot: Secondary | ICD-10-CM | POA: Insufficient documentation

## 2018-06-05 DIAGNOSIS — Z09 Encounter for follow-up examination after completed treatment for conditions other than malignant neoplasm: Secondary | ICD-10-CM | POA: Insufficient documentation

## 2018-06-05 DIAGNOSIS — M722 Plantar fascial fibromatosis: Secondary | ICD-10-CM | POA: Insufficient documentation

## 2018-06-05 DIAGNOSIS — Z79899 Other long term (current) drug therapy: Secondary | ICD-10-CM | POA: Insufficient documentation

## 2018-06-05 MED ORDER — TRAMADOL HCL 50 MG PO TABS: 50.0000 mg | ORAL_TABLET | Freq: Three times a day (TID) | ORAL | 0 refills | Status: DC | PRN

## 2018-06-05 NOTE — Progress Notes (Signed)
Patient ID: Jasmine Martin, female    DOB: [birth year]  MRN: [id number]  CC: Hospitalization Follow-up (ED f/u)   Subjective: Jasmine Martin is a 57 y.o. female who presents for ER f/u.  Her mother is with her. Her concerns today include:  Pt with hx of plantar fasciitis, GERD, eosinophilic esophagitis, obesity, upper airway cough syndrome  Patient presents for ER follow-up.  She was seen on face down on 06/01/2018 due to a mechanical fall.  Patient states that she went to get up from the chair at the hair salon and thinks either her foot got caught in a metal bar or her shoes twisted when she went to step on the floor.  She fell forward on her knees and on outstretched hands.  She reports that her right side particularly the knee and shoulder got the worst of it.  She has a lot of pain, bruising and swelling of the right knee and swelling of the right ankle.  She was having some pins-and-needles in the right forearm.  Some pain over the right elbow but no swelling there.  In the ER she had extensive imaging including CAT scan of the head and cervical spine which were negative for intracranial bleed and cervical fracture.  X-ray of the right shoulder and knee were negative for acute fractures.  All other x-rays were also negative for acute fracture.  Patient was discharged home in a knee immobilizer with instructions of RICE.  Given tramadol and Naprosyn for pain.  She has been taking the tramadol every 6 hours and Naprosyn twice a day as prescribed. -Not using the knee immobilizer as it is too cumbersome to get on and off. -She has the right knee and lower leg wrapped with an Ace wrap. -Not able to bear weight on the right side.  She has been using a rolling walker for mobility. -Not able to elevate the right shoulder  Patient states that she submitted a form for food stamps that she would like for me to complete.  She wants me to verify that she cannot work.  She has not worked in years  because according to her mother she is allergic to everything mainly the sun.  States that she gets uncomfortable and itchy when exposed to the sun.  She has cardboard boxes up to her windows at home.  States that she is also allergic to things like blood pressure cuff would cause her to break out.  She has never seen an allergist.  Reports having seen a dermatologist many years ago who told her that she needed to stay at home Patient Active Problem List   Diagnosis Date Noted  . Upper airway cough syndrome 05/13/2016  . Eosinophilic esophagitis 40/98/1191  . Elevated BP 03/29/2016  . History of asthma 03/29/2016  . Seasonal allergies 02/02/2016  . GERD (gastroesophageal reflux disease) 01/11/2016  . Severe obesity (BMI >= 40) ([place]) 01/11/2016     Current Outpatient Medications on File Prior to Visit  Medication Sig Dispense Refill  . albuterol (PROVENTIL HFA;VENTOLIN HFA) 108 (90 Base) MCG/ACT inhaler Inhale 2 puffs into the lungs every 6 (six) hours as needed for wheezing or shortness of breath. 8 g 3  . cetirizine (ZYRTEC) 10 MG tablet Take 1 tablet (10 mg total) by mouth daily. 30 tablet 5  . Multiple Vitamins-Minerals (MULTIVITAMIN WITH MINERALS) tablet Take 1 tablet by mouth daily. Reported on 01/01/2016    . naproxen (NAPROSYN) 375 MG tablet Take 1 tablet (375  mg total) by mouth 2 (two) times daily. 20 tablet 0  . ondansetron (ZOFRAN-ODT) 8 MG disintegrating tablet Take 1 tablet (8 mg total) by mouth every 8 (eight) hours as needed for nausea. 12 tablet 0  . traMADol (ULTRAM) 50 MG tablet Take 1 tablet (50 mg total) by mouth every 6 (six) hours as needed. 15 tablet 0   No current facility-administered medications on file prior to visit.     Allergies  Allergen Reactions  . Latex Itching and Rash    Social History   Socioeconomic History  . Marital status: Single    Spouse name: Not on file  . Number of children: 0  . Years of education: Not on file  . Highest education  level: Not on file  Occupational History  . Occupation: Unemployed  Social Needs  . Financial resource strain: Not on file  . Food insecurity:    Worry: Not on file    Inability: Not on file  . Transportation needs:    Medical: Not on file    Non-medical: Not on file  Tobacco Use  . Smoking status: Never Smoker  . Smokeless tobacco: Never Used  Substance and Sexual Activity  . Alcohol use: No    Alcohol/week: 0.0 oz  . Drug use: No  . Sexual activity: Not Currently  Lifestyle  . Physical activity:    Days per week: Not on file    Minutes per session: Not on file  . Stress: Not on file  Relationships  . Social connections:    Talks on phone: Not on file    Gets together: Not on file    Attends religious service: Not on file    Active member of club or organization: Not on file    Attends meetings of clubs or organizations: Not on file    Relationship status: Not on file  . Intimate partner violence:    Fear of current or ex partner: Not on file    Emotionally abused: Not on file    Physically abused: Not on file    Forced sexual activity: Not on file  Other Topics Concern  . Not on file  Social History Narrative  . Not on file    Family History  Problem Relation Age of Onset  . Liver cancer Father   . Hypertension Maternal Aunt   . Colon polyps Mother   . Diabetes Maternal Grandfather   . Diabetes Maternal Aunt   . Esophageal cancer Paternal Grandfather   . Colon cancer Neg Hx   . Kidney disease Neg Hx     Past Surgical History:  Procedure Laterality Date  . ABDOMINAL HYSTERECTOMY    . CHOLECYSTECTOMY      ROS: Review of Systems Negative except as stated above PHYSICAL EXAM: BP (!) 150/80   Pulse 76   Temp 98.3 F (36.8 C) (Oral)   Resp 16   Wt 240 lb (108.9 kg)   SpO2 96%   BMI 37.03 kg/m   Physical Exam  General appearance - alert, well appearing, middle-aged Caucasian female sitting in wheelchair and in no distress Mental status - normal  mood, behavior, speech, dress, motor activity, and thought processes Musculoskeletal -right knee: Significant swelling and ecchymosis.  Moderate discomfort with palpation.  She resists movement of the knee due to pain. Right ankle: Mild to moderate edema.  Point tenderness over the medial and lateral malleolus.  She resists flexion and extension of the joint due to pain.  No  ecchymosis seen. Hands/wrist: No edema or erythema seen.  Good range of motion of the fingers.  Good range of motion of the wrists. Right elbow: No edema or erythema.  No ecchymosis seen.  No tenderness on palpation over the olecranon and epicondyles.  Good range of motion. Right shoulder: Small area of bruising noted on the mid tricep.  Good passive range of motion.  Unable to do any active range of motion   ASSESSMENT AND PLAN: 1. Contusion of right knee, initial encounter Very significant. Encourage to elevate and use ice and heat packs alternating.  She will remain nonweightbearing on this leg for about a week.  We will need to see whether she improves back to baseline if not she will need imaging to rule out torn tendons or meniscus. Refill given on tramadol to use as needed - traMADol (ULTRAM) 50 MG tablet; Take 1 tablet (50 mg total) by mouth every 8 (eight) hours as needed.  Dispense: 30 tablet; Refill: 0 - Ambulatory referral to Orthopedic Surgery  2. Acute right ankle pain Significant sprain.  She will not weight-bear on this for about a week.  Continue to elevate.  Ice and heat alternating - traMADol (ULTRAM) 50 MG tablet; Take 1 tablet (50 mg total) by mouth every 8 (eight) hours as needed.  Dispense: 30 tablet; Refill: 0 - Ambulatory referral to Orthopedic Surgery  3. Right shoulder pain, unspecified chronicity Will probably need advanced imaging.  We will see how she does for the next few weeks with anti-inflammatory and tramadol - traMADol (ULTRAM) 50 MG tablet; Take 1 tablet (50 mg total) by mouth every 8  (eight) hours as needed.  Dispense: 30 tablet; Refill: 0 - Ambulatory referral to Orthopedic Surgery  4. Allergic state, initial encounter Advised patient that I can complete form for her to temporarily receive food stamps based on the current injury that she has experienced but not on the history of not being able to work because she is allergic to everything.  Advised referral to allergist for testing and verification - Ambulatory referral to Allergy  5. Elevated blood pressure reading Likely due to pain and use of NSAID.  Recheck on follow-up visit  Patient was given the opportunity to ask questions.  Patient verbalized understanding of the plan and was able to repeat key elements of the plan.   Orders Placed This Encounter  Procedures  . Ambulatory referral to Orthopedic Surgery  . Ambulatory referral to Allergy     Requested Prescriptions   Signed Prescriptions Disp Refills  . traMADol (ULTRAM) 50 MG tablet 30 tablet 0    Sig: Take 1 tablet (50 mg total) by mouth every 8 (eight) hours as needed.    No follow-ups on file.  Karle Plumber, MD, FACP

## 2018-06-05 NOTE — Patient Instructions (Signed)

## 2018-06-07 ENCOUNTER — Telehealth: Payer: Self-pay

## 2018-06-07 NOTE — Telephone Encounter (Signed)
Contacted both pt and pt mother left a detailed vm on both numbers to inform them that we have found the copy of her food and nutrition service form that was needing to be signed and filled out by provider they can pick it up at the front desk but before they leave with the form we will need pt to fill out her part of the application so we can make a copy of it and put it in her chart and if they have any questions or concerns to give me a call

## 2018-08-06 ENCOUNTER — Ambulatory Visit: Payer: No Typology Code available for payment source | Admitting: Allergy and Immunology

## 2020-02-20 ENCOUNTER — Other Ambulatory Visit: Payer: Self-pay

## 2020-02-20 ENCOUNTER — Encounter (HOSPITAL_COMMUNITY): Payer: Self-pay

## 2020-02-20 ENCOUNTER — Emergency Department (HOSPITAL_COMMUNITY): Payer: Self-pay

## 2020-02-20 ENCOUNTER — Emergency Department (HOSPITAL_COMMUNITY)
Admission: EM | Admit: 2020-02-20 | Discharge: 2020-02-20 | Disposition: A | Discharge status: Home / Self Care | Payer: Self-pay | Attending: Emergency Medicine | Admitting: Emergency Medicine

## 2020-02-20 ENCOUNTER — Encounter: Payer: Self-pay | Admitting: Cardiovascular Disease

## 2020-02-20 ENCOUNTER — Ambulatory Visit: Payer: Self-pay

## 2020-02-20 ENCOUNTER — Ambulatory Visit (INDEPENDENT_AMBULATORY_CARE_PROVIDER_SITE_OTHER): Payer: Self-pay | Admitting: Cardiovascular Disease

## 2020-02-20 DIAGNOSIS — Z8709 Personal history of other diseases of the respiratory system: Secondary | ICD-10-CM | POA: Insufficient documentation

## 2020-02-20 DIAGNOSIS — R0602 Shortness of breath: Secondary | ICD-10-CM | POA: Insufficient documentation

## 2020-02-20 DIAGNOSIS — R110 Nausea: Secondary | ICD-10-CM | POA: Insufficient documentation

## 2020-02-20 DIAGNOSIS — R0789 Other chest pain: Secondary | ICD-10-CM | POA: Insufficient documentation

## 2020-02-20 DIAGNOSIS — R42 Dizziness and giddiness: Secondary | ICD-10-CM | POA: Insufficient documentation

## 2020-02-20 DIAGNOSIS — R079 Chest pain, unspecified: Secondary | ICD-10-CM

## 2020-02-20 DIAGNOSIS — R1011 Right upper quadrant pain: Secondary | ICD-10-CM | POA: Insufficient documentation

## 2020-02-20 DIAGNOSIS — I200 Unstable angina: Secondary | ICD-10-CM

## 2020-02-20 DIAGNOSIS — Z7982 Long term (current) use of aspirin: Secondary | ICD-10-CM | POA: Insufficient documentation

## 2020-02-20 HISTORY — DX: Unstable angina: I20.0

## 2020-02-20 LAB — CBC
HCT: 49.6 % — ABNORMAL HIGH (ref 36.0–46.0)
Hemoglobin: 15.8 g/dL — ABNORMAL HIGH (ref 12.0–15.0)
MCH: 29.2 pg (ref 26.0–34.0)
MCHC: 31.9 g/dL (ref 30.0–36.0)
MCV: 91.7 fL (ref 80.0–100.0)
Platelets: 318 10*3/uL (ref 150–400)
RBC: 5.41 MIL/uL — ABNORMAL HIGH (ref 3.87–5.11)
RDW: 13.6 % (ref 11.5–15.5)
WBC: 7.3 10*3/uL (ref 4.0–10.5)
nRBC: 0 % (ref 0.0–0.2)

## 2020-02-20 LAB — BASIC METABOLIC PANEL
Anion gap: 13 (ref 5–15)
BUN: 17 mg/dL (ref 6–20)
CO2: 26 mmol/L (ref 22–32)
Calcium: 9.3 mg/dL (ref 8.9–10.3)
Chloride: 103 mmol/L (ref 98–111)
Creatinine, Ser: 0.85 mg/dL (ref 0.44–1.00)
GFR calc Af Amer: >60 mL/min (ref >60)
GFR calc non Af Amer: >60 mL/min (ref >60)
Glucose, Bld: 91 mg/dL (ref 70–99)
Potassium: 3.9 mmol/L (ref 3.5–5.1)
Sodium: 142 mmol/L (ref 135–145)

## 2020-02-20 LAB — HEPATIC FUNCTION PANEL
ALT: 21 U/L (ref 0–44)
AST: 25 U/L (ref 15–41)
Albumin: 3.7 g/dL (ref 3.5–5.0)
Alkaline Phosphatase: 50 U/L (ref 38–126)
Bilirubin, Direct: 0.2 mg/dL (ref 0.0–0.2)
Indirect Bilirubin: 0.7 mg/dL (ref 0.3–0.9)
Total Bilirubin: 0.9 mg/dL (ref 0.3–1.2)
Total Protein: 7.2 g/dL (ref 6.5–8.1)

## 2020-02-20 LAB — TROPONIN I (HIGH SENSITIVITY)
Troponin I (High Sensitivity): 4 ng/L (ref <18)
Troponin I (High Sensitivity): 4 ng/L (ref <18)
Troponin I (High Sensitivity): 7 ng/L (ref <18)

## 2020-02-20 LAB — URINALYSIS, ROUTINE W REFLEX MICROSCOPIC
Bilirubin Urine: NEGATIVE
Glucose, UA: NEGATIVE mg/dL
Hgb urine dipstick: NEGATIVE
Ketones, ur: 5 mg/dL — AB
Leukocytes,Ua: NEGATIVE
Nitrite: NEGATIVE
Protein, ur: NEGATIVE mg/dL
Specific Gravity, Urine: 1.011 (ref 1.005–1.030)
pH: 7 (ref 5.0–8.0)

## 2020-02-20 LAB — D-DIMER, QUANTITATIVE: D-Dimer, Quant: 0.76 ug/mL-FEU — ABNORMAL HIGH (ref 0.00–0.50)

## 2020-02-20 LAB — LIPASE, BLOOD: Lipase: 31 U/L (ref 11–51)

## 2020-02-20 LAB — BRAIN NATRIURETIC PEPTIDE: B Natriuretic Peptide: 95.2 pg/mL (ref 0.0–100.0)

## 2020-02-20 MED ORDER — MORPHINE SULFATE (PF) 4 MG/ML IV SOLN
4.0000 mg | Freq: Once | INTRAVENOUS | Status: DC
  Filled 2020-02-20: qty 1

## 2020-02-20 MED ORDER — NITROGLYCERIN 2 % TD OINT
1.0000 [in_us] | TOPICAL_OINTMENT | Freq: Once | TRANSDERMAL | Status: AC
  Administered 2020-02-20: 1 [in_us] via TOPICAL
  Filled 2020-02-20: qty 1

## 2020-02-20 MED ORDER — ONDANSETRON HCL 4 MG/2ML IJ SOLN
4.0000 mg | Freq: Once | INTRAMUSCULAR | Status: DC
  Filled 2020-02-20: qty 2

## 2020-02-20 MED ORDER — IOHEXOL 350 MG/ML SOLN
100.0000 mL | Freq: Once | INTRAVENOUS | Status: AC | PRN
  Administered 2020-02-20: 100 mL via INTRAVENOUS

## 2020-02-20 MED ORDER — ASPIRIN EC 81 MG PO TBEC: 81.0000 mg | DELAYED_RELEASE_TABLET | Freq: Every day | ORAL | 2 refills | Status: AC

## 2020-02-20 MED ORDER — NITROGLYCERIN 0.4 MG SL SUBL
0.4000 mg | SUBLINGUAL_TABLET | SUBLINGUAL | Status: AC | PRN
  Administered 2020-02-20: 0.4 mg via SUBLINGUAL

## 2020-02-20 NOTE — ED Provider Notes (Signed)
[facility] Provider Note   CSN: [id number] Arrival date & time: 02/20/20  1051     History Chief Complaint  Patient presents with  . Chest Pain    Jasmine Martin is a 59 y.o. female.  HPI      Jasmine Martin is a 59 y.o. female, with a history of asthma, cholecystectomy, presenting to the ED with chest pain beginning this morning.  Pain is right sided, sharp, nonradiating, 8/10. Accompanied by shortness of breath. She states she was taking her mother to a doctor appt, walked up several stairs, and began to experience her chest pain.  It was accompanied by shortness of breath, dizziness, and nausea at that time.  She received 1 dose of sublingual nitroglycerin at the doctor's office and a second dose with EMS, along with 324 mg aspirin. Her pain resolved after these interventions, but then recurred a short time ago and has been constant since recurrence. Patient does note increased lower extremity edema over the past few weeks.  She states she has difficulty lying supine and this has been the case for several years.  She is unable to tell me if this is worsening, because she states she never lies flat. Denies fever/chills, recent illness, cough, vomiting, diarrhea, abdominal pain, neurologic deficits, syncope, or any other complaints.   Patient was asked about her diagnosis of unstable angina or any previous heart issues.  She denies any of these issues.  She states she does not have a cardiologist.  Past Medical History:  Diagnosis Date  . Asthma   . Unstable angina ([place]) 02/20/2020    Patient Active Problem List   Diagnosis Date Noted  . Unstable angina ([place]) 02/20/2020  . Contusion of right knee 06/05/2018  . Acute right ankle pain 06/05/2018  . Upper airway cough syndrome 05/13/2016  . Eosinophilic esophagitis [id number]  . Elevated BP 03/29/2016  . History of asthma 03/29/2016  . Seasonal allergies 02/02/2016  . GERD  (gastroesophageal reflux disease) 01/11/2016  . Severe obesity (BMI >= 40) ([place]) 01/11/2016    Past Surgical History:  Procedure Laterality Date  . ABDOMINAL HYSTERECTOMY    . CHOLECYSTECTOMY       OB History    Gravida  0   Para  0   Term  0   Preterm  0   AB  0   Living  0     SAB  0   TAB  0   Ectopic  0   Multiple  0   Live Births  0           Family History  Problem Relation Age of Onset  . Liver cancer Father   . Hypertension Maternal Aunt   . Colon polyps Mother   . Diabetes Maternal Grandfather   . Diabetes Maternal Aunt   . Esophageal cancer Paternal Grandfather   . Colon cancer Neg Hx   . Kidney disease Neg Hx     Social History   Tobacco Use  . Smoking status: Never Smoker  . Smokeless tobacco: Never Used  Substance Use Topics  . Alcohol use: No    Alcohol/week: 0.0 standard drinks  . Drug use: No    Home Medications Prior to Admission medications   Medication Sig Start Date End Date Taking? Authorizing Provider  acetaminophen (TYLENOL) 500 MG tablet Take 500 mg by mouth every 6 (six) hours as needed for mild pain.   Yes [provider]  aspirin EC 81 MG tablet Take 1 tablet (81 mg total) by mouth daily. 02/20/20 05/20/20  Sherica Paternostro, Helane Gunther, PA-C    Allergies    Latex  Review of Systems   Review of Systems  Constitutional: Negative for chills, diaphoresis and fever.  Respiratory: Positive for shortness of breath. Negative for cough.   Cardiovascular: Positive for chest pain and leg swelling.  Gastrointestinal: Positive for nausea. Negative for abdominal pain, diarrhea and vomiting.  Genitourinary: Negative for dysuria, flank pain and hematuria.  Musculoskeletal: Negative for back pain.  Neurological: Positive for dizziness (resolved). Negative for syncope, weakness and numbness.  All other systems reviewed and are negative.   Physical Exam Updated Vital Signs BP (!) 146/89 (BP Location: Right Wrist)   Pulse 80   Temp  98.2 F (36.8 C) (Oral)   Resp 18   Ht 5' 7.5" (1.715 m)   SpO2 96%   BMI 37.03 kg/m   Physical Exam Vitals and nursing note reviewed.  Constitutional:      General: She is not in acute distress.    Appearance: She is well-developed. She is not diaphoretic.  HENT:     Head: Normocephalic and atraumatic.     Mouth/Throat:     Mouth: Mucous membranes are moist.     Pharynx: Oropharynx is clear.  Eyes:     Conjunctiva/sclera: Conjunctivae normal.  Cardiovascular:     Rate and Rhythm: Normal rate and regular rhythm.     Pulses: Normal pulses.          Radial pulses are 2+ on the right side and 2+ on the left side.       Posterior tibial pulses are 2+ on the right side and 2+ on the left side.     Heart sounds: Normal heart sounds.     Comments: Tactile temperature in the extremities appropriate and equal bilaterally. Pulmonary:     Effort: Pulmonary effort is normal. No respiratory distress.     Breath sounds: Normal breath sounds.     Comments: Conversational tachypnea. Chest:     Chest wall: Tenderness present. No deformity, swelling or crepitus.    Abdominal:     Palpations: Abdomen is soft.     Tenderness: There is abdominal tenderness. There is no guarding.       Comments: Patient really is quite tender in multiple places of the abdomen, but seems to be the worst in the right upper quadrant.  Musculoskeletal:     Cervical back: Neck supple.     Right lower leg: No edema.     Left lower leg: No edema.     Comments: Patient has compression stockings in place.  Even after these were removed, I was unable to discern any lower extremity edema.  Lymphadenopathy:     Cervical: No cervical adenopathy.  Skin:    General: Skin is warm and dry.  Neurological:     Mental Status: She is alert.  Psychiatric:        Mood and Affect: Mood and affect normal.        Speech: Speech normal.        Behavior: Behavior normal.     ED Results / Procedures / Treatments    Labs (all labs ordered are listed, but only abnormal results are displayed) Labs Reviewed  CBC - Abnormal; Notable for the following components:      Result Value   RBC 5.41 (*)    Hemoglobin 15.8 (*)    HCT  49.6 (*)    All other components within normal limits  URINALYSIS, ROUTINE W REFLEX MICROSCOPIC - Abnormal; Notable for the following components:   Color, Urine STRAW (*)    Ketones, ur 5 (*)    All other components within normal limits  D-DIMER, QUANTITATIVE (NOT AT [facility]) - Abnormal; Notable for the following components:   D-Dimer, Quant 0.76 (*)    All other components within normal limits  BASIC METABOLIC PANEL  HEPATIC FUNCTION PANEL  LIPASE, BLOOD  BRAIN NATRIURETIC PEPTIDE  TROPONIN I (HIGH SENSITIVITY)  TROPONIN I (HIGH SENSITIVITY)  TROPONIN I (HIGH SENSITIVITY)    EKG EKG Interpretation  Date/Time:  Thursday February 20 2020 11:10:33 EDT Ventricular Rate:  75 PR Interval:  138 QRS Duration: 86 QT Interval:  396 QTC Calculation: 442 R Axis:   37 Text Interpretation: Normal sinus rhythm Normal ECG motion artifact Otherwise no significant change Confirmed by Addison Lank [id number]) on 02/20/2020 11:57:38 PM   Radiology DG Chest 2 View  Result Date: 02/20/2020 CLINICAL DATA:  Chest pain, left arm pain EXAM: CHEST - 2 VIEW COMPARISON:  03/23/2016 FINDINGS: Heart and mediastinal contours are within normal limits. No focal opacities or effusions. No acute bony abnormality. IMPRESSION: No active cardiopulmonary disease. Electronically Signed   By: Rolm Baptise M.D.   On: 02/20/2020 11:28   CT Angio Chest PE W and/or Wo Contrast  Result Date: 02/20/2020 CLINICAL DATA:  Right-sided chest pain EXAM: CT ANGIOGRAPHY CHEST WITH CONTRAST TECHNIQUE: Multidetector CT imaging of the chest was performed using the standard protocol during bolus administration of intravenous contrast. Multiplanar CT image reconstructions and MIPs were obtained to evaluate the vascular anatomy.  CONTRAST:  151mL OMNIPAQUE IOHEXOL 350 MG/ML SOLN COMPARISON:  02/20/2020 FINDINGS: Cardiovascular: This is a technically adequate evaluation of the pulmonary vasculature. No filling defects or pulmonary emboli. The heart is unremarkable without pericardial effusion. Normal caliber of the thoracic aorta. No evidence of dissection. Mediastinum/Nodes: No enlarged mediastinal, hilar, or axillary lymph nodes. Thyroid gland, trachea, and esophagus demonstrate no significant findings. Lungs/Pleura: No airspace disease, effusion, or pneumothorax. Central airways are widely patent. Upper Abdomen: No acute abnormality. Musculoskeletal: No acute displaced fractures. Reconstructed images demonstrate no additional findings. Review of the MIP images confirms the above findings. IMPRESSION: 1. No evidence of pulmonary embolus. 2. No acute intrathoracic process. Electronically Signed   By: Randa Ngo M.D.   On: 02/20/2020 18:58   CT ABDOMEN PELVIS W CONTRAST  Result Date: 02/20/2020 CLINICAL DATA:  Unspecified abdominal pain, right-sided chest pain EXAM: CT ABDOMEN AND PELVIS WITH CONTRAST TECHNIQUE: Multidetector CT imaging of the abdomen and pelvis was performed using the standard protocol following bolus administration of intravenous contrast. CONTRAST:  125mL OMNIPAQUE IOHEXOL 350 MG/ML SOLN COMPARISON:  None. FINDINGS: Lower chest: No acute pleural or parenchymal lung disease. Hepatobiliary: Mild diffuse hepatic steatosis. No focal liver abnormality. Gallbladder is surgically absent. Pancreas: Unremarkable. No pancreatic ductal dilatation or surrounding inflammatory changes. Spleen: Normal in size without focal abnormality. Adrenals/Urinary Tract: Adrenal glands are unremarkable. Kidneys are normal, without renal calculi, focal lesion, or hydronephrosis. There is some early excretion of contrast within the lower pole calices which may obscure small nonobstructing calculi. Bladder is unremarkable. Stomach/Bowel: No  bowel obstruction or ileus. Normal appendix right lower quadrant. No bowel wall thickening or inflammatory change. Vascular/Lymphatic: No significant atherosclerosis. Incidental retroaortic left renal vein, a frequent anatomic variant. No pathologic adenopathy. Reproductive: Status post hysterectomy. No adnexal masses. Other: No abdominal wall hernia or abnormality. No  abdominopelvic ascites. Musculoskeletal: No acute or destructive bony lesions. Reconstructed images demonstrate no additional findings. IMPRESSION: 1. No acute intra-abdominal or intrapelvic process. 2. Mild diffuse hepatic steatosis. Electronically Signed   By: Randa Ngo M.D.   On: 02/20/2020 19:01    Procedures Procedures (including critical care time)  Medications Ordered in ED Medications  ondansetron (ZOFRAN) injection 4 mg (4 mg Intravenous Refused 02/20/20 1528)  morphine 4 MG/ML injection 4 mg (4 mg Intravenous Refused 02/20/20 1528)  nitroGLYCERIN (NITROGLYN) 2 % ointment 1 inch (1 inch Topical Given 02/20/20 1528)  iohexol (OMNIPAQUE) 350 MG/ML injection 100 mL (100 mLs Intravenous Contrast Given 02/20/20 1834)    ED Course  I have reviewed the triage vital signs and the nursing notes.  Pertinent labs & imaging results that were available during my care of the patient were reviewed by me and considered in my medical decision making (see chart for details).  Clinical Course as of Feb 20 6  Thu Feb 20, 2020  [id number] Patient rechecked. Pulse rate is normal. NO bradycardia.  Pulse Rate(!): 36 [SJ]  1929 Spoke with Dr. Audie Box, cardiologist.  We discussed the patient's symptoms, exertional onset, resolution with nitroglycerin, and lab/imaging findings. He states if she has negative troponins then it will be ACS.  Recommends checking one more troponin.  If this is negative, she can be discharged with close clinic follow-up. States she can be seen in clinic sometime in the next week.    [SJ]    Clinical Course User  Index [SJ] Bronte Kropf, Helane Gunther, PA-C   MDM Rules/Calculators/A&P                      Patient presents with chest pain beginning this morning. Question of possible ACS due to exertional onset, as well as resolution with nitroglycerin. Patient is nontoxic appearing, afebrile, not tachycardic, not tachypneic, not hypotensive, maintains excellent SPO2 on room air, and is in no apparent distress.  I reviewed and interpreted the patient's labs and radiological studies. D-dimer mildly elevated.  CT PE study without acute abnormality. CT abdomen/pelvis without acute abnormality. 3 troponins negative.  Other lab results also reassuring.  EKG without acute abnormality or signs of ischemia.  She will have close follow-up with cardiology.  She was also advised to follow-up with her PCP. Strict return precautions discussed.  Patient voices understanding of these instructions, accepts the plan, and is comfortable with discharge.   Vitals:   02/20/20 1915 02/20/20 2015 02/20/20 2115 02/20/20 2200  BP: 134/76 137/80 125/73 131/75  Pulse: 74 71 75 76  Resp: 18 16 14 18   Temp:      TempSrc:      SpO2: 95% 95% 92% 95%  Height:        Final Clinical Impression(s) / ED Diagnoses Final diagnoses:  Chest pain, unspecified type    Rx / DC Orders ED Discharge Orders         Ordered    Ambulatory referral to Cardiology     02/20/20 2030    aspirin EC 81 MG tablet  Daily     02/20/20 2030           Layla Maw 02/21/20 0008    Isla Pence, MD 02/24/20 [phone]

## 2020-02-20 NOTE — Discharge Instructions (Signed)
Your work-up today was overall reassuring. Please begin taking the 81 mg aspirin daily. Please follow-up with the cardiologist on this matter. Return to the emergency department for worsening symptoms.

## 2020-02-20 NOTE — ED Notes (Signed)
Upon discharge, pt states that her car is located at the cardiologist. Liana Gerold pt cab voucher to car. Pt stated "I have never ever driven at night, there's no way I can do that. I have no idea how to drive, let alone at night". Pt and her mother both came here from cardiologist office to d/t chest pain while at office. Pt and mother both report that there is no person or family that is able to come pick pt up from ED. Friend called while discharging pt, this RN asked pt if that friend could come, and pt stated that "friend had a hole in her head" and she would be unable. Spoke w/ SW to obtain approval for voucher to home for $36, voucher approved. Pt stated "How are we supposed to get up the steps to the house then?". Asked pt what her plan was coming from cardiologist to get home, pt and mother stated "We didn't know, we were just going to go get sandwiches and figure it out from there!" Asked pt how she was able to leave house and drive today, she stated that she "just did, but could not do it now". Involved Loralie Champagne, and CM Mariann Laster to facilitate transport home

## 2020-02-20 NOTE — ED Triage Notes (Signed)
Pt bib ems for chest pain. Pt was at a doctors office for her moms appointment when she started to have right sided chest pain, worse with palpation. The office gave her 1 Nitro and EMS gave her another nitro and 324 ASA which brought her pain down to a 7 from a 9/10. Pt a.o, VSS, 20G L hand

## 2020-02-20 NOTE — ED Notes (Signed)
Involved Jasmine Martin w/ CM, pt d/c'd w/ steady gait in no distress in cab.

## 2020-02-20 NOTE — Addendum Note (Signed)
Addended by: Alvina Filbert B on: 02/20/2020 10:41 AM   Modules accepted: Orders

## 2020-02-20 NOTE — Progress Notes (Signed)
Cardiology Office Note   Date:  02/20/2020   ID:  Jasmine Martin, DOB 1960-11-20, MRN [id number]  PCP:  Ladell Pier, MD  Cardiologist:   Skeet Latch, MD   No chief complaint on file.     History of Present Illness: Jasmine Martin is a 59 y.o. female with morbid obesity and GERD who presents for a visit with her mother when she developed unstable angina.  Jasmine Martin reports that she and her mother have been renovating a house.  They have been painting up to 15 gallons of paint and she has been overexerting herself.  Today when bring her mother to the appointment she had to go up and down the stairs quickly.  When she arrived to the room she was extremely dyspneic.  She also reports substernal chest burning.  Her symptoms did not resolve with time.  Blood pressure was 138/34.  EKG showed sinus rhythm at 79 bpm and no acute changes.  She had low voltage.  She was unable to get up onto the exam table due to lightheadedness.  Therefore EMS was called.    Past Medical History:  Diagnosis Date  . Asthma   . Unstable angina ([place]) 02/20/2020    Past Surgical History:  Procedure Laterality Date  . ABDOMINAL HYSTERECTOMY    . CHOLECYSTECTOMY       Current Outpatient Medications  Medication Sig Dispense Refill  . albuterol (PROVENTIL HFA;VENTOLIN HFA) 108 (90 Base) MCG/ACT inhaler Inhale 2 puffs into the lungs every 6 (six) hours as needed for wheezing or shortness of breath. 8 g 3  . cetirizine (ZYRTEC) 10 MG tablet Take 1 tablet (10 mg total) by mouth daily. 30 tablet 5  . Multiple Vitamins-Minerals (MULTIVITAMIN WITH MINERALS) tablet Take 1 tablet by mouth daily. Reported on 01/01/2016    . naproxen (NAPROSYN) 375 MG tablet Take 1 tablet (375 mg total) by mouth 2 (two) times daily. 20 tablet 0  . ondansetron (ZOFRAN-ODT) 8 MG disintegrating tablet Take 1 tablet (8 mg total) by mouth every 8 (eight) hours as needed for nausea. 12 tablet 0  . traMADol (ULTRAM) 50 MG  tablet Take 1 tablet (50 mg total) by mouth every 6 (six) hours as needed. 15 tablet 0  . traMADol (ULTRAM) 50 MG tablet Take 1 tablet (50 mg total) by mouth every 8 (eight) hours as needed. 30 tablet 0   No current facility-administered medications for this visit.    Allergies:   Latex    Social History:  The patient  reports that she has never smoked. She has never used smokeless tobacco. She reports that she does not drink alcohol or use drugs.   Family History:  The patient's family history includes Colon polyps in her mother; Diabetes in her maternal aunt and maternal grandfather; Esophageal cancer in her paternal grandfather; Hypertension in her maternal aunt; Liver cancer in her father.    ROS:  Please see the history of present illness.   Otherwise, review of systems are positive for none.   All other systems are reviewed and negative.    PHYSICAL EXAM: VS:  There were no vitals taken for this visit. , BMI There is no height or weight on file to calculate BMI. GENERAL:  Well appearing HEENT:  Pupils equal round and reactive, fundi not visualized, oral mucosa unremarkable NECK:  No jugular venous distention, waveform within normal limits, carotid upstroke brisk and symmetric, no bruits, no thyromegaly LYMPHATICS:  No cervical adenopathy  LUNGS:  Clear to auscultation bilaterally HEART:  RRR.  PMI not displaced or sustained,S1 and S2 within normal limits, no S3, no S4, no clicks, no rubs, no murmurs ABD:  Flat, positive bowel sounds normal in frequency in pitch, no bruits, no rebound, no guarding, no midline pulsatile mass, no hepatomegaly, no splenomegaly EXT:  2 plus pulses throughout, no edema, no cyanosis no clubbing SKIN:  No rashes no nodules NEURO:  Cranial nerves II through XII grossly intact, motor grossly intact throughout PSYCH:  Cognitively intact, oriented to person place and time    EKG:  EKG is ordered today. The ekg ordered today demonstrates sinus rhythm.  79  bpm.  Low voltage.   Recent Labs: No results found for requested labs within last 8760 hours.    Lipid Panel    Component Value Date/Time   CHOL 155 03/29/2016 1056   TRIG 151 (H) 03/29/2016 1056   HDL 49 03/29/2016 1056   CHOLHDL 3.2 03/29/2016 1056   VLDL 30 03/29/2016 1056   LDLCALC 76 03/29/2016 1056      Wt Readings from Last 3 Encounters:  06/05/18 240 lb (108.9 kg)  12/01/17 291 lb (132 kg)  11/23/17 291 lb 12.8 oz (132.4 kg)      ASSESSMENT AND PLAN:  # Unstable angina: Jasmine Martin presented to clinic today to accompany her mother.  Upon coming in she became extremely dyspneic and had substernal chest pressure that did not resolve.  EKG was unremarkable.  Vitals were otherwise stable.  She received a nitroglycerin but it has not yet helped.  It made her head feel funny.  EMS has been called and she will need to be evaluated in the ED.   Current medicines are reviewed at length with the patient today.  The patient does not have concerns regarding medicines.  The following changes have been made:  no change  Labs/ tests ordered today include:  No orders of the defined types were placed in this encounter.    Disposition:   FU with Aarion Kittrell C. Oval Linsey, MD, [facility] in 1 month     Signed, Jacquelinne Speak C. Oval Linsey, MD, [facility]  02/20/2020 9:37 AM    [facility]

## 2020-02-27 ENCOUNTER — Ambulatory Visit: Payer: Self-pay | Attending: Internal Medicine

## 2020-02-27 DIAGNOSIS — Z23 Encounter for immunization: Secondary | ICD-10-CM

## 2020-02-27 NOTE — Progress Notes (Signed)
   Covid-19 Vaccination Clinic  Name:  Jasmine Martin    MRN: [id number] DOB: [birth year]  02/27/2020  Ms. [name] was observed post Covid-19 immunization for 15 minutes without incident. She was provided with Vaccine Information Sheet and instruction to access the V-Safe system.   Ms. [name] was instructed to call 911 with any severe reactions post vaccine: Marland Kitchen Difficulty breathing  . Swelling of face and throat  . A fast heartbeat  . A bad rash all over body  . Dizziness and weakness   Immunizations Administered    Name Date Dose VIS Date Route   Pfizer COVID-19 Vaccine 02/27/2020 11:03 AM 0.3 mL 01/01/2019 Intramuscular   Manufacturer: [employer]   Lot: [id number]   [place]: [id number]

## 2020-03-02 ENCOUNTER — Telehealth: Payer: Self-pay | Admitting: Cardiovascular Disease

## 2020-03-02 NOTE — Telephone Encounter (Signed)
Reviewed chart and do not see where anyone has left a message. Spoke with Pam in billing and she is needing to reach out to patient regarding insurance She will speak with patient and let me know if patient needs anything clinical

## 2020-03-02 NOTE — Telephone Encounter (Signed)
Patient states she is returning a call, however no notes or current test results available. Please advise.

## 2020-03-04 NOTE — Progress Notes (Signed)
Cardiology Clinic Note   Patient Name: Jasmine Martin Date of Encounter: 03/05/2020  Primary Care Provider:  Ladell Pier, MD Primary Cardiologist:  Skeet Latch, MD  Patient Profile    Jasmine Martin. Jasmine Martin 59 year old female presents today for follow-up of her unstable angina, and HTN.  Past Medical History    Past Medical History:  Diagnosis Date  . Asthma   . Unstable angina ([place]) 02/20/2020   Past Surgical History:  Procedure Laterality Date  . ABDOMINAL HYSTERECTOMY    . CHOLECYSTECTOMY      Allergies  Allergies  Allergen Reactions  . Latex Itching and Rash    History of Present Illness    Jasmine Martin has a PMH of unstable angina, GERD, eosinophilic esophagitis, severe obesity, and hypertension.  She presented to the clinic on 02/20/2020 with her mother when she developed unstable angina.  She reported that she had been renovating a house with her mother.  She had been painting up to 15 gallons of paint and had been overexerting herself.  She indicated that as she was bringing her mother to the appointment today she had to ascended descended stairs quickly.  Upon arrival to the exam room she was strongly diaphoretic.  She also reported substernal burning.  Her symptoms did not resolve.  Her blood pressure was 138/34.  EKG showed normal sinus rhythm 79 bpm with no acute changes.  She was unable to climb onto the exam table due to lightheadedness.  EMS was called at that time.  She presented to the emergency department on 02/20/2020 via EMS after an episode of chest pain [facility].  Her EKG at that time was normal sinus rhythm 75 bpm.  She reported her chest pain was sharp nonradiating and 8 out of 10.  This was accompanied by shortness of breath.  She was given sublingual nitro x2 and ASA 324 mg.  This resolved her pain.  Her troponins were negative x3, D-dimer mildly elevated, CT abdomen pelvis showed no acute abnormalities.  She was discharged with a  recommendation to take aspirin 81 daily.  She presents the clinic today for further evaluation and states she has had no further episodes of chest pain like the one that took her to the hospital on 02/20/20.  She has had brief occasional sharp chest pain that lasts for 2 to 3 seconds and then dissipates with rest.  She also indicates that she is had fatigue and dyspnea on exertion for the past several months.  She states she has not been very physically active in the last year.  She also states over the last year she has noticed increased swelling in her lower extremities.  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  I will order an echocardiogram, give her the salty 6 sheet, encourage her to check her blood pressures and give her a log.  I will have her follow-up with Dr. Oval Linsey in 3 months.  Today she denies chest pain, shortness of breath, lower extremity edema, fatigue, palpitations, melena, hematuria, hemoptysis, diaphoresis, weakness, presyncope, syncope, orthopnea, and PND.   Home Medications    Prior to Admission medications   Medication Sig Start Date End Date Taking? Authorizing Provider  acetaminophen (TYLENOL) 500 MG tablet Take 500 mg by mouth every 6 (six) hours as needed for mild pain.    [provider]  aspirin EC 81 MG tablet Take 1 tablet (81 mg  total) by mouth daily. 02/20/20 05/20/20  Lorayne Bender, PA-C    Family History    Family History  Problem Relation Age of Onset  . Liver cancer Father   . Hypertension Maternal Aunt   . Colon polyps Mother   . Diabetes Maternal Grandfather   . Diabetes Maternal Aunt   . Esophageal cancer Paternal Grandfather   . Colon cancer Neg Hx   . Kidney disease Neg Hx    She indicated that the status of her mother is unknown. She indicated that the status of her father is unknown. She indicated that the status of her maternal grandfather is unknown. She indicated that the status of  her paternal grandfather is unknown. She indicated that the status of her neg hx is unknown.  Social History    Social History   Socioeconomic History  . Marital status: Single    Spouse name: Not on file  . Number of children: 0  . Years of education: Not on file  . Highest education level: Not on file  Occupational History  . Occupation: Unemployed  Tobacco Use  . Smoking status: Never Smoker  . Smokeless tobacco: Never Used  Substance and Sexual Activity  . Alcohol use: No    Alcohol/week: 0.0 standard drinks  . Drug use: No  . Sexual activity: Not Currently  Other Topics Concern  . Not on file  Social History Narrative  . Not on file   Social Determinants of Health   Financial Resource Strain:   . Difficulty of Paying Living Expenses:   Food Insecurity:   . Worried About Charity fundraiser in the Last Year:   . Arboriculturist in the Last Year:   Transportation Needs:   . Film/video editor (Medical):   Marland Kitchen Lack of Transportation (Non-Medical):   Physical Activity:   . Days of Exercise per Week:   . Minutes of Exercise per Session:   Stress:   . Feeling of Stress :   Social Connections:   . Frequency of Communication with Friends and Family:   . Frequency of Social Gatherings with Friends and Family:   . Attends Religious Services:   . Active Member of Clubs or Organizations:   . Attends Archivist Meetings:   Marland Kitchen Marital Status:   Intimate Partner Violence:   . Fear of Current or Ex-Partner:   . Emotionally Abused:   Marland Kitchen Physically Abused:   . Sexually Abused:      Review of Systems    General:  No chills, fever, night sweats or weight changes.  Cardiovascular:  No chest pain, dyspnea on exertion, edema, orthopnea, palpitations, paroxysmal nocturnal dyspnea. Dermatological: No rash, lesions/masses Respiratory: No cough, dyspnea Urologic: No hematuria, dysuria Abdominal:   No nausea, vomiting, diarrhea, bright red blood per rectum, melena,  or hematemesis Neurologic:  No visual changes, wkns, changes in mental status. All other systems reviewed and are otherwise negative except as noted above.  Physical Exam    VS:  BP 134/82   Pulse 81   Ht 5' 7.5" (1.715 m)   Wt 298 lb 3.2 oz (135.3 kg)   SpO2 94%   BMI 46.02 kg/m  , BMI Body mass index is 46.02 kg/m. GEN: Well nourished, well developed, in no acute distress. HEENT: normal. Neck: Supple, no JVD, carotid bruits, or masses. Cardiac: RRR, no murmurs, rubs, or gallops. No clubbing, cyanosis, bilateral +1 lower extremity pitting edema.  Radials/DP/PT 2+ and equal bilaterally.  Respiratory:  Respirations regular and unlabored, clear to auscultation bilaterally. GI: Soft, nontender, nondistended, BS + x 4. MS: no deformity or atrophy. Skin: warm and dry, no rash. Neuro:  Strength and sensation are intact. Psych: Normal affect.  Accessory Clinical Findings    ECG personally reviewed by me today-none today.  EKG 02/21/2020 Sinus rhythm 75 bpm no ST or T wave deviations  Assessment & Plan   1.  Unstable angina/DOE/fatigue -no chest pain today.  With activity  and bringing her mother to the cardiology clinic on 02/20/2020 she developed angina, diaphoresis, and lightheadedness.  She was transferred to [facility] ED for further evaluation. She was given sublingual nitro x2 and ASA 324 mg.  This resolved her pain.  Her troponins were negative x3, D-dimer mildly elevated, CT abdomen pelvis showed no acute abnormalities.  She was discharged with a recommendation to take aspirin 81 daily.  Suspect this is related to OHS versus COVID-19 infection and her severe obesity. Continue 81 mg aspirin daily Heart healthy low-sodium diet-salty 6 given Increase physical activity as tolerated Order echocardiogram  Severe obesity-weight today 298.2 Heart healthy low-sodium diet Increase physical activity as tolerated Continue weight loss  Disposition: Follow-up with Dr. Oval Linsey in 3  months.  Jossie Ng. Richardine Peppers NP-C    03/05/2020, 12:02 PM [address] Office [phone] Fax [phone]

## 2020-03-05 ENCOUNTER — Ambulatory Visit (INDEPENDENT_AMBULATORY_CARE_PROVIDER_SITE_OTHER): Payer: Self-pay | Admitting: General Practice

## 2020-03-05 ENCOUNTER — Encounter: Payer: Self-pay | Admitting: General Practice

## 2020-03-05 ENCOUNTER — Other Ambulatory Visit: Payer: Self-pay

## 2020-03-05 VITALS — BP 134/82 | HR 81 | Ht 67.5 in | Wt 298.2 lb

## 2020-03-05 DIAGNOSIS — R5383 Other fatigue: Secondary | ICD-10-CM

## 2020-03-05 DIAGNOSIS — R0609 Other forms of dyspnea: Secondary | ICD-10-CM

## 2020-03-05 DIAGNOSIS — R0600 Dyspnea, unspecified: Secondary | ICD-10-CM

## 2020-03-05 DIAGNOSIS — I200 Unstable angina: Secondary | ICD-10-CM

## 2020-03-05 NOTE — Patient Instructions (Signed)
Medication Instructions:  Your physician recommends that you continue on your current medications as directed. Please refer to the Current Medication list given to you today.  *If you need a refill on your cardiac medications before your next appointment, please call your pharmacy*    Testing/Procedures: Your physician has requested that you have an echocardiogram. Echocardiography is a painless test that uses sound waves to create images of your heart. It provides your doctor with information about the size and shape of your heart and how well your heart's chambers and valves are working. This procedure takes approximately one hour. There are no restrictions for this procedure.    Follow-Up: At [facility], you and your health needs are our priority.  As part of our continuing mission to provide you with exceptional heart care, we have created designated Provider Care Teams.  These Care Teams include your primary Cardiologist (physician) and Advanced Practice Providers (APPs -  Physician Assistants and Nurse Practitioners) who all work together to provide you with the care you need, when you need it.  We recommend signing up for the patient portal called "MyChart".  Sign up information is provided on this After Visit Summary.  MyChart is used to connect with patients for Virtual Visits (Telemedicine).  Patients are able to view lab/test results, encounter notes, upcoming appointments, etc.  Non-urgent messages can be sent to your provider as well.   To learn more about what you can do with MyChart, go to [email or web address].    Your next appointment:   3 month(s)  The format for your next appointment:   In Person  Provider:   Skeet Latch, MD   Other Instructions  Increase physical activity as tolerated.  Please review the Salty Six information sheet given to you today.  Your physician has requested that you regularly monitor and record your blood pressure readings at  home. Please use the same machine at the same time of day to check your readings and record them to bring to your follow-up visit.

## 2020-03-23 ENCOUNTER — Ambulatory Visit: Payer: Self-pay | Attending: Internal Medicine

## 2020-03-23 DIAGNOSIS — Z23 Encounter for immunization: Secondary | ICD-10-CM

## 2020-03-23 NOTE — Progress Notes (Signed)
   Covid-19 Vaccination Clinic  Name:  MAYLYNN STROWBRIDGE    MRN: [id number] DOB: 14-Dec-1960  03/23/2020  Ms. Borunda was observed post Covid-19 immunization for 15 minutes without incident. She was provided with Vaccine Information Sheet and instruction to access the V-Safe system.   Ms. Tokunaga was instructed to call 911 with any severe reactions post vaccine: Marland Kitchen Difficulty breathing  . Swelling of face and throat  . A fast heartbeat  . A bad rash all over body  . Dizziness and weakness   Immunizations Administered    Name Date Dose VIS Date Route   Pfizer COVID-19 Vaccine 03/23/2020 11:57 AM 0.3 mL 01/01/2019 Intramuscular   Manufacturer: [employer]   Lot: [id number]   [place]: [id number]

## 2020-03-24 ENCOUNTER — Other Ambulatory Visit: Payer: Self-pay

## 2020-03-24 ENCOUNTER — Ambulatory Visit (HOSPITAL_COMMUNITY): Payer: Self-pay | Attending: Cardiovascular Disease

## 2020-03-24 DIAGNOSIS — I200 Unstable angina: Secondary | ICD-10-CM

## 2020-03-24 DIAGNOSIS — R0609 Other forms of dyspnea: Secondary | ICD-10-CM

## 2020-03-24 DIAGNOSIS — R0600 Dyspnea, unspecified: Secondary | ICD-10-CM | POA: Insufficient documentation

## 2020-03-24 DIAGNOSIS — R5383 Other fatigue: Secondary | ICD-10-CM

## 2020-05-19 ENCOUNTER — Ambulatory Visit: Payer: Self-pay | Admitting: Cardiovascular Disease

## 2020-09-24 ENCOUNTER — Ambulatory Visit: Payer: Self-pay

## 2020-10-30 ENCOUNTER — Emergency Department (HOSPITAL_COMMUNITY)
Admission: EM | Admit: 2020-10-30 | Discharge: 2020-10-30 | Disposition: A | Discharge status: Home / Self Care | Payer: Self-pay | Attending: Emergency Medicine | Admitting: Emergency Medicine

## 2020-10-30 ENCOUNTER — Encounter (HOSPITAL_COMMUNITY): Payer: Self-pay | Admitting: Emergency Medicine

## 2020-10-30 ENCOUNTER — Emergency Department (HOSPITAL_COMMUNITY): Payer: Self-pay

## 2020-10-30 ENCOUNTER — Other Ambulatory Visit: Payer: Self-pay

## 2020-10-30 DIAGNOSIS — R1013 Epigastric pain: Secondary | ICD-10-CM | POA: Insufficient documentation

## 2020-10-30 DIAGNOSIS — R310 Gross hematuria: Secondary | ICD-10-CM | POA: Insufficient documentation

## 2020-10-30 DIAGNOSIS — Z91040 Latex allergy status: Secondary | ICD-10-CM | POA: Insufficient documentation

## 2020-10-30 DIAGNOSIS — R1032 Left lower quadrant pain: Secondary | ICD-10-CM | POA: Insufficient documentation

## 2020-10-30 DIAGNOSIS — R079 Chest pain, unspecified: Secondary | ICD-10-CM | POA: Insufficient documentation

## 2020-10-30 DIAGNOSIS — K219 Gastro-esophageal reflux disease without esophagitis: Secondary | ICD-10-CM | POA: Insufficient documentation

## 2020-10-30 DIAGNOSIS — J45909 Unspecified asthma, uncomplicated: Secondary | ICD-10-CM | POA: Insufficient documentation

## 2020-10-30 LAB — BASIC METABOLIC PANEL
Anion gap: 11 (ref 5–15)
BUN: 11 mg/dL (ref 6–20)
CO2: 25 mmol/L (ref 22–32)
Calcium: 9.5 mg/dL (ref 8.9–10.3)
Chloride: 104 mmol/L (ref 98–111)
Creatinine, Ser: 0.56 mg/dL (ref 0.44–1.00)
GFR, Estimated: >60 mL/min (ref >60)
Glucose, Bld: 102 mg/dL — ABNORMAL HIGH (ref 70–99)
Potassium: 4.3 mmol/L (ref 3.5–5.1)
Sodium: 140 mmol/L (ref 135–145)

## 2020-10-30 LAB — URINALYSIS, ROUTINE W REFLEX MICROSCOPIC
Bacteria, UA: NONE SEEN
Bilirubin Urine: NEGATIVE
Glucose, UA: NEGATIVE mg/dL
Ketones, ur: NEGATIVE mg/dL
Leukocytes,Ua: NEGATIVE
Nitrite: NEGATIVE
Protein, ur: NEGATIVE mg/dL
RBC / HPF: >50 RBC/hpf — ABNORMAL HIGH (ref 0–5)
Specific Gravity, Urine: 1.013 (ref 1.005–1.030)
pH: 7 (ref 5.0–8.0)

## 2020-10-30 LAB — CBC
HCT: 49.9 % — ABNORMAL HIGH (ref 36.0–46.0)
Hemoglobin: 15.9 g/dL — ABNORMAL HIGH (ref 12.0–15.0)
MCH: 29.3 pg (ref 26.0–34.0)
MCHC: 31.9 g/dL (ref 30.0–36.0)
MCV: 92.1 fL (ref 80.0–100.0)
Platelets: 226 10*3/uL (ref 150–400)
RBC: 5.42 MIL/uL — ABNORMAL HIGH (ref 3.87–5.11)
RDW: 13.8 % (ref 11.5–15.5)
WBC: 7.5 10*3/uL (ref 4.0–10.5)
nRBC: 0 % (ref 0.0–0.2)

## 2020-10-30 LAB — TROPONIN I (HIGH SENSITIVITY)
Troponin I (High Sensitivity): 4 ng/L (ref <18)
Troponin I (High Sensitivity): 5 ng/L (ref <18)

## 2020-10-30 LAB — POC OCCULT BLOOD, ED: Fecal Occult Bld: NEGATIVE

## 2020-10-30 MED ORDER — IOHEXOL 300 MG/ML  SOLN
100.0000 mL | Freq: Once | INTRAMUSCULAR | Status: AC | PRN
  Administered 2020-10-30: 100 mL via INTRAVENOUS

## 2020-10-30 NOTE — ED Notes (Signed)
Mother at bedside.

## 2020-10-30 NOTE — ED Notes (Signed)
Pt transported to CT ?

## 2020-10-30 NOTE — ED Triage Notes (Signed)
Patient reports she was feeling weak this morning and had trouble getting out of bed to go pee. Patient reports she had a BM on herself and there was blood in her stool (bright red blood)

## 2020-10-30 NOTE — ED Provider Notes (Signed)
[facility] DEPT Provider Note   CSN: [id number] Arrival date & time: 10/30/20  1255     History Chief Complaint  Patient presents with  . Weakness  . [place] [employer] is a 59 y.o. female.  HPI      Jasmine Martin is a 59 y.o. female, with a history of asthma, unstable angina, morbid obesity, presenting to the ED with blood per rectum beginning this morning. Patient states she had 2 episodes of bright red blood from the rectum while in bed with the last episode around 9:30 AM this morning.  She has not had a bowel movement or episode of bleeding since 9:30 AM this morning.  She states it would not be unusual for her to have a bowel movement without being able to make it to the toilet. She also endorses feeling generally weak this morning. She does endorse some abdominal cramping and pressure mainly in the left lower abdomen and epigastric regions, rated 8/10. Well in the room waiting to be seen, she states she began to have chest discomfort across the entire chest, "feels like spider webs across the chest," mild, nonradiating.  She denies any history of GI bleeding.  Denies anticoagulation.  She is Covid vaccinated.  Denies fever/chills, nausea/vomiting, melena, shortness of breath, cough, urinary symptoms, or any other complaints.    Past Medical History:  Diagnosis Date  . Asthma   . Unstable angina ([place]) 02/20/2020    Patient Active Problem List   Diagnosis Date Noted  . Unstable angina ([place]) 02/20/2020  . Contusion of right knee 06/05/2018  . Acute right ankle pain 06/05/2018  . Upper airway cough syndrome 05/13/2016  . Eosinophilic esophagitis [id number]  . Elevated BP 03/29/2016  . History of asthma 03/29/2016  . Seasonal allergies 02/02/2016  . GERD (gastroesophageal reflux disease) 01/11/2016  . Severe obesity (BMI >= 40) ([name]) 01/11/2016    Past Surgical History:  Procedure Laterality Date  .  ABDOMINAL HYSTERECTOMY    . CHOLECYSTECTOMY       OB History    Gravida  0   Para  0   Term  0   Preterm  0   AB  0   Living  0     SAB  0   IAB  0   Ectopic  0   Multiple  0   Live Births  0           Family History  Problem Relation Age of Onset  . Liver cancer Father   . Hypertension Maternal Aunt   . Colon polyps Mother   . Diabetes Maternal Grandfather   . Diabetes Maternal Aunt   . Esophageal cancer Paternal Grandfather   . Colon cancer Neg Hx   . Kidney disease Neg Hx     Social History   Tobacco Use  . Smoking status: Never Smoker  . Smokeless tobacco: Never Used  Substance Use Topics  . Alcohol use: No    Alcohol/week: 0.0 standard drinks  . Drug use: No    Home Medications Prior to Admission medications   Medication Sig Start Date End Date Taking? Authorizing Provider  acetaminophen (TYLENOL) 500 MG tablet Take 500 mg by mouth every 6 (six) hours as needed for mild pain.    [provider]    Allergies    Latex  Review of Systems   Review of Systems  Constitutional: Negative for chills and fever.  Respiratory: Negative for cough and shortness of breath.   Cardiovascular: Positive for chest pain.  Gastrointestinal: Positive for abdominal pain, blood in stool and nausea. Negative for vomiting.  Genitourinary: Negative for dysuria and hematuria.  Musculoskeletal: Negative for back pain.  Neurological: Positive for weakness (generally) and light-headedness. Negative for dizziness and syncope.  All other systems reviewed and are negative.   Physical Exam Updated Vital Signs BP (!) 146/88 (BP Location: Right Arm)   Pulse 80   Temp 98 F (36.7 C) (Oral)   Resp 20   Ht 5' 7.5" (1.715 m)   Wt 129.3 kg   SpO2 99%   BMI 43.98 kg/m   Physical Exam Vitals and nursing note reviewed.  Constitutional:      General: She is not in acute distress.    Appearance: She is well-developed. She is obese. She is not diaphoretic.   HENT:     Head: Normocephalic and atraumatic.     Mouth/Throat:     Mouth: Mucous membranes are moist.     Pharynx: Oropharynx is clear.  Eyes:     Conjunctiva/sclera: Conjunctivae normal.  Cardiovascular:     Rate and Rhythm: Normal rate and regular rhythm.     Pulses: Normal pulses.          Radial pulses are 2+ on the right side and 2+ on the left side.       Posterior tibial pulses are 2+ on the right side and 2+ on the left side.     Heart sounds: Normal heart sounds.     Comments: Tactile temperature in the extremities appropriate and equal bilaterally. Pulmonary:     Effort: Pulmonary effort is normal. No respiratory distress.     Breath sounds: Normal breath sounds.  Abdominal:     Palpations: Abdomen is soft.     Tenderness: There is abdominal tenderness. There is no guarding.    Genitourinary:    Rectum: Guaiac result negative.     Comments: Rectal Exam:  No external hemorrhoids, fissures, or lesions noted.  No frank blood or melena. Some soft stool in rectal vault.  No rectal tenderness. No foreign bodies noted.   Med Tech served as chaperone during the rectal exam. No noted obvious vaginal bleeding based on external exam. Musculoskeletal:     Cervical back: Neck supple.     Right lower leg: No edema.     Left lower leg: No edema.  Skin:    General: Skin is warm and dry.  Neurological:     Mental Status: She is alert.     Comments: No noted acute cognitive deficit. Sensation grossly intact to light touch in the extremities.   Grip strengths equal bilaterally.   Strength 5/5 in all extremities.  Coordination intact.  Cranial nerves III-XII grossly intact.  Handles oral secretions without noted difficulty.  No noted phonation or speech deficit. No facial droop.   Psychiatric:        Mood and Affect: Mood and affect normal.        Speech: Speech normal.        Behavior: Behavior normal.     ED Results / Procedures / Treatments   Labs (all labs ordered  are listed, but only abnormal results are displayed) Labs Reviewed  BASIC METABOLIC PANEL - Abnormal; Notable for the following components:      Result Value   Glucose, Bld 102 (*)    All other components within normal limits  CBC - Abnormal;  Notable for the following components:   RBC 5.42 (*)    Hemoglobin 15.9 (*)    HCT 49.9 (*)    All other components within normal limits  URINALYSIS, ROUTINE W REFLEX MICROSCOPIC - Abnormal; Notable for the following components:   APPearance HAZY (*)    Hgb urine dipstick LARGE (*)    RBC / HPF >50 (*)    All other components within normal limits  URINE CULTURE  POC OCCULT BLOOD, ED  TROPONIN I (HIGH SENSITIVITY)  TROPONIN I (HIGH SENSITIVITY)    EKG EKG Interpretation  Date/Time:  Friday October 30 2020 13:32:33 EST Ventricular Rate:  77 PR Interval:    QRS Duration: 98 QT Interval:  359 QTC Calculation: 407 R Axis:   50 Text Interpretation: Sinus rhythm Low voltage, precordial leads 12 Lead; [place] No significant change since last tracing Confirmed by Blanchie Dessert [phone]) on 10/30/2020 5:26:53 PM   Radiology CT ABDOMEN PELVIS W CONTRAST  Result Date: 10/30/2020 CLINICAL DATA:  59 year old female with GI bleed. EXAM: CT ABDOMEN AND PELVIS WITH CONTRAST TECHNIQUE: Multidetector CT imaging of the abdomen and pelvis was performed using the standard protocol following bolus administration of intravenous contrast. CONTRAST:  185mL OMNIPAQUE IOHEXOL 300 MG/ML  SOLN COMPARISON:  CT abdomen pelvis dated 02/20/2020. FINDINGS: Lower chest: The visualized lung bases are clear. No intra-abdominal free air or free fluid. Hepatobiliary: Fatty liver. No intrahepatic biliary ductal dilatation. Cholecystectomy. No retained calcified stone noted in the central CBD. Pancreas: Unremarkable. No pancreatic ductal dilatation or surrounding inflammatory changes. Spleen: Normal in size without focal abnormality. Adrenals/Urinary Tract: The adrenal  glands unremarkable. There is a 10 mm nonobstructing left renal inferior pole calculus. There is a 3 mm stone in the upper pole of the right kidney. There is no hydronephrosis on either side. There is symmetric enhancement and excretion of contrast by both kidneys. The visualized ureters and urinary bladder appear unremarkable Stomach/Bowel: There is moderate stool throughout the colon. No bowel obstruction or active inflammation. The appendix is normal. Vascular/Lymphatic: The abdominal aorta and IVC unremarkable. No portal venous gas. There is no adenopathy. Reproductive: Hysterectomy. No adnexal masses. Other: None Musculoskeletal: Degenerative changes of the spine. No acute osseous pathology. IMPRESSION: 1. No acute intra-abdominal or pelvic pathology. No bowel obstruction. Normal appendix. 2. Fatty liver. 3. Nonobstructing bilateral renal calculi. No hydronephrosis. Electronically Signed   By: Anner Crete M.D.   On: 10/30/2020 18:29    Chart review reveals colonoscopy performed March 2016 by Dr. Deatra Ina with [facility].  COLON FINDINGS: A sessile polyp measuring 2 mm in size was found at the cecum. A polypectomy was performed with cold forceps. The remainder of the exam including rectum, sigmoid, descending colon, transverse colon, and ascending colon was normal. Retroflexed views revealed no abnormalities. The time to cecum = 6.4 Withdrawal time = 7.6 The scope was withdrawn and the procedure completed. COMPLICATIONS: There were no immediate complications. ENDOSCOPIC IMPRESSION: Sessile polyp was found at the cecum; polypectomy was performed with cold forceps Pathology analysis of the removed polyp: Diagnosis Surgical [P], cecum, polyp - TUBULAR ADENOMA. - NO HIGH GRADE DYSPLASIA OR MALIGNANCY IDENTIFIED.    Procedures Procedures (including critical care time)  Medications Ordered in ED Medications  iohexol (OMNIPAQUE) 300 MG/ML solution 100 mL (100 mLs Intravenous Contrast Given  10/30/20 1757)    ED Course  I have reviewed the triage vital signs and the nursing notes.  Pertinent labs & imaging results that were available during my care of  the patient were reviewed by me and considered in my medical decision making (see chart for details).    MDM Rules/Calculators/A&P                          Patient presents with complaint of rectal bleeding. Patient is nontoxic appearing, afebrile, not tachycardic, not tachypneic, not hypotensive, maintains excellent SPO2 on room air, and is in no apparent distress.   I have reviewed the patient's chart to obtain more information.   I reviewed and interpreted the patient's labs and radiological studies. No anemia or leukocytosis.  Hemoccult negative. There was, however, hematuria on UA.  After this finding was discussed with the patient, she states, "Now I guess it really was blood in my urine rather than from the rectum." The chest pain patient mentions which started prior to my evaluation resolved a few minutes later without recurrence.  Delta troponins negative.  No acute changes on EKG.  Urology follow-up. Return precautions discussed.  Patient voices understanding of these instructions, accepts the plan, and is comfortable with discharge.   Findings and plan of care discussed with attending physician, Blanchie Dessert, MD.   Vitals:   10/30/20 1852 10/30/20 1900 10/30/20 1930 10/30/20 2000  BP: (!) 145/88 (!) 142/66 137/83 (!) 145/84  Pulse: 91 81 81 80  Resp: 20 16 20 20   Temp:      TempSrc:      SpO2: 98% 97% 96% 95%  Weight:      Height:         Final Clinical Impression(s) / ED Diagnoses Final diagnoses:  Gross hematuria    Rx / DC Orders ED Discharge Orders    None       Layla Maw 10/30/20 2029    Blanchie Dessert, MD 10/30/20 2242

## 2020-10-30 NOTE — Discharge Instructions (Addendum)
There was noted to be blood in the urine.  Work-up was overall reassuring otherwise. Recommend follow-up with urology on this matter.  Call to make an appointment.

## 2020-10-30 NOTE — ED Notes (Signed)
Discharge instructions reviewed with pt and mother. Pt and mother verbalized understanding. Stated they would follow up with urology.

## 2020-10-31 LAB — URINE CULTURE: Culture: NO GROWTH

## 2022-04-06 IMAGING — CT CT ABD-PELV W/ CM
2 of 6 series · 16 of 46 positions shown, 18 images · IV contrast (APPLIED)
Comparison: None.

CLINICAL DATA: Unspecified abdominal pain, right-sided chest pain

EXAM:
CT ABDOMEN AND PELVIS WITH CONTRAST
TECHNIQUE: Multidetector CT imaging of the abdomen and pelvis was performed
using the standard protocol following bolus administration of
intravenous contrast.
CONTRAST:  100mL OMNIPAQUE IOHEXOL 350 MG/ML SOLN

[Series 7: thins · axial · 0.98mm/px · z∈[+761,+1220]mm · 13 of 503 slices shown, 15 images]
[im 22/503  soft-tissue]
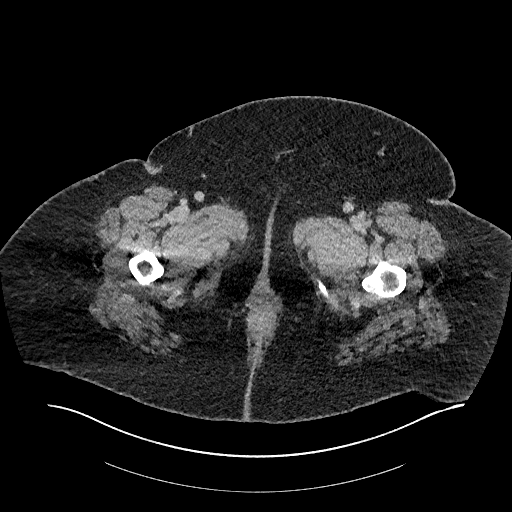
[im 22/503  bone]
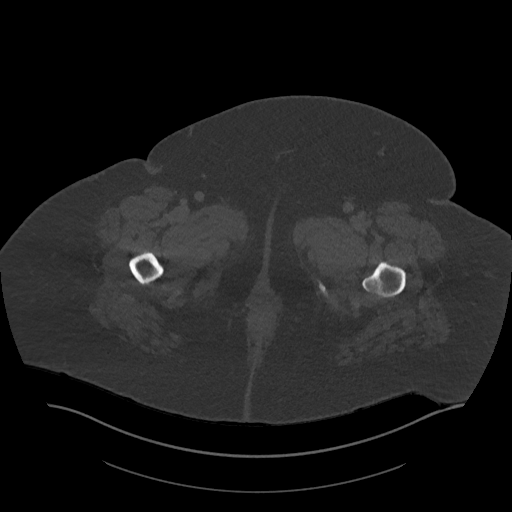
[im 66/503  soft-tissue]
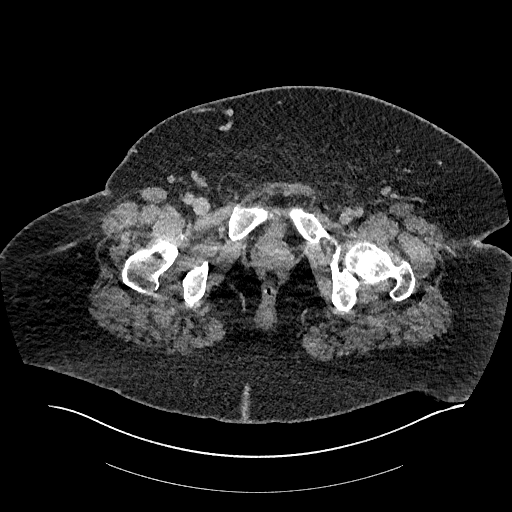
[im 110/503  soft-tissue]
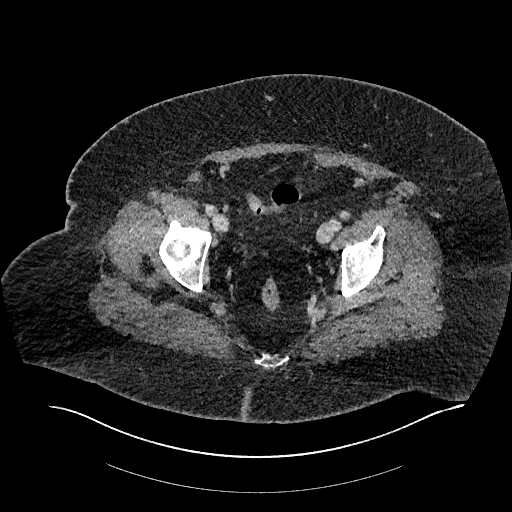
[im 131/503  soft-tissue]
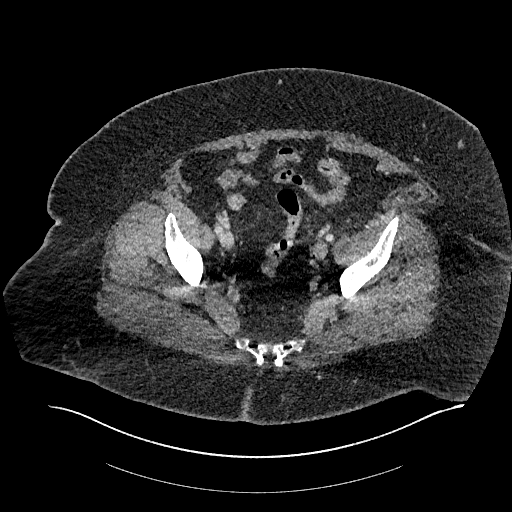
[im 175/503  soft-tissue]
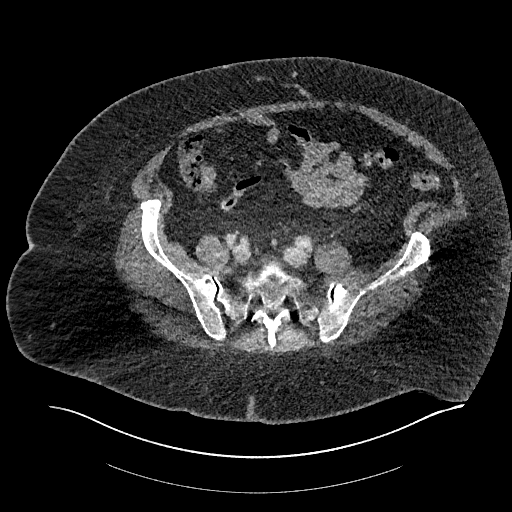
[im 219/503  soft-tissue]
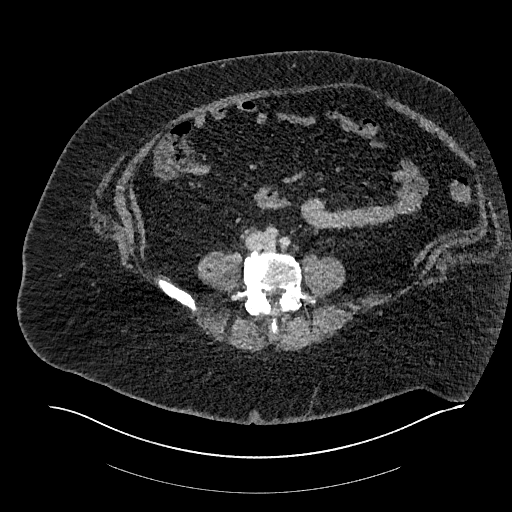
[im 262/503  soft-tissue]
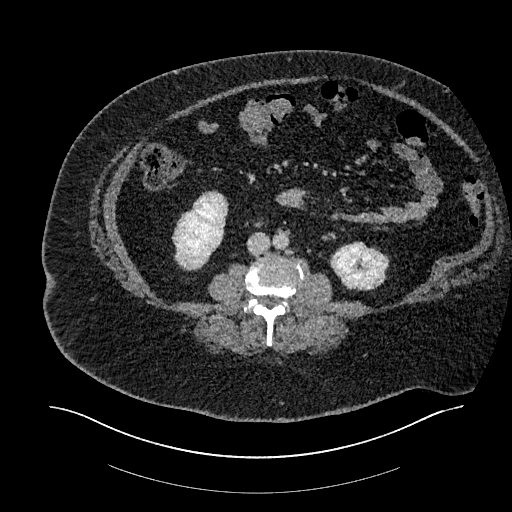
[im 284/503  soft-tissue]
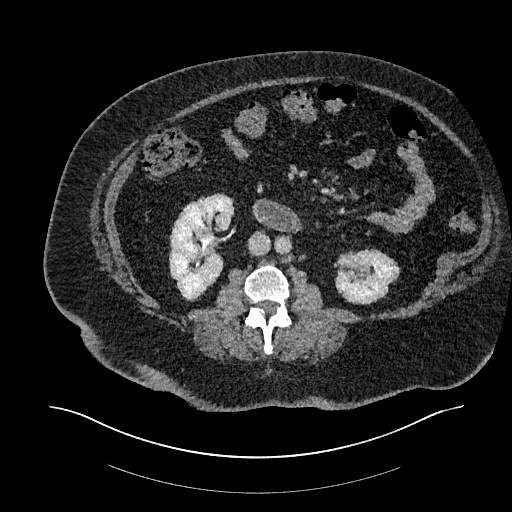
[im 328/503  soft-tissue]
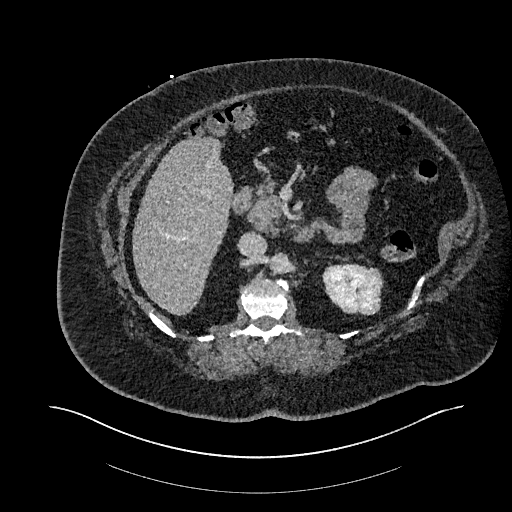
[im 328/503  bone]
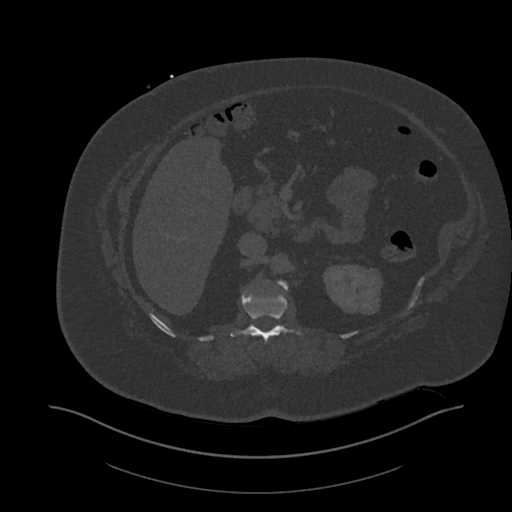
[im 372/503  soft-tissue]
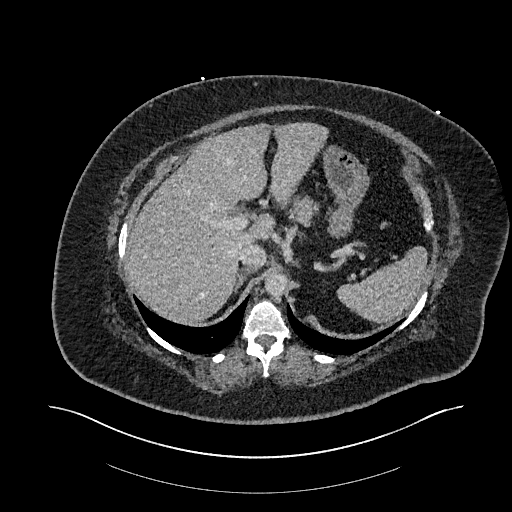
[im 393/503  soft-tissue]
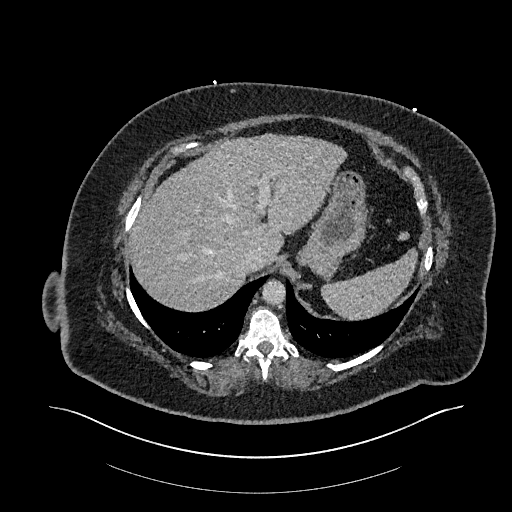
[im 437/503  soft-tissue]
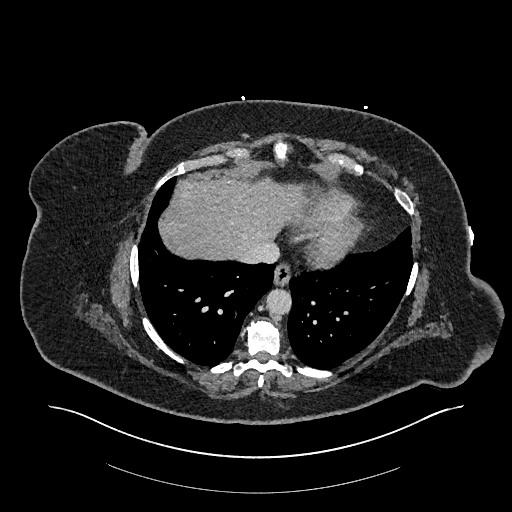
[im 481/503  soft-tissue]
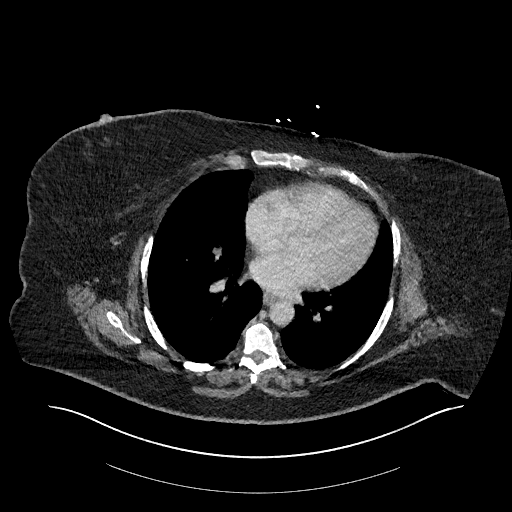

[Series 8: coronals · coronal · 0.98mm/px · 3 of 152 slices shown]
[im 51/152  soft-tissue]
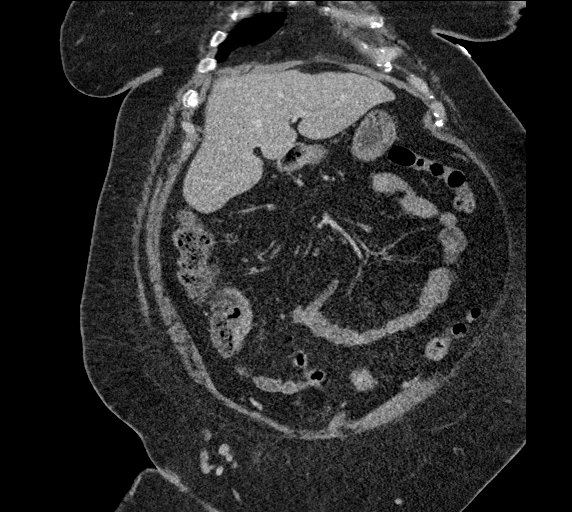
[im 68/152  soft-tissue]
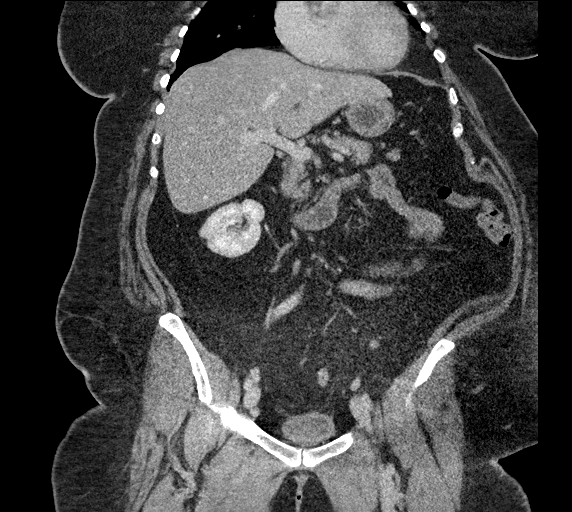
[im 84/152  soft-tissue]
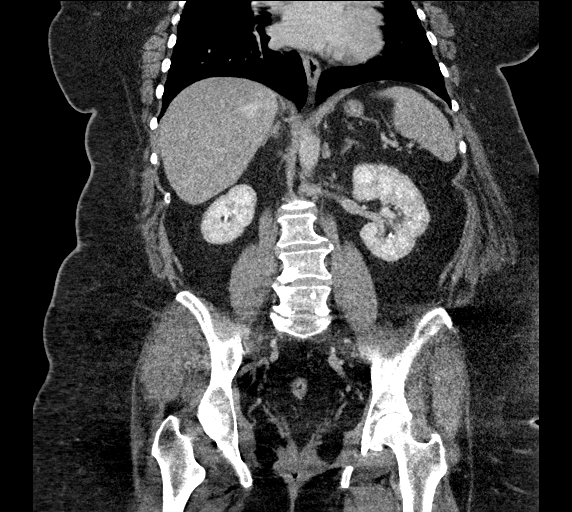

[16 of 46 positions shown; findings below may reference images not displayed]

FINDINGS: Lower chest: No acute pleural or parenchymal lung disease.

Hepatobiliary: Mild diffuse hepatic steatosis. No focal liver
abnormality. Gallbladder is surgically absent.

Pancreas: Unremarkable. No pancreatic ductal dilatation or
surrounding inflammatory changes.

Spleen: Normal in size without focal abnormality.

Adrenals/Urinary Tract: Adrenal glands are unremarkable. Kidneys are
normal, without renal calculi, focal lesion, or hydronephrosis.
There is some early excretion of contrast within the lower pole
calices which may obscure small nonobstructing calculi. Bladder is
unremarkable.

Stomach/Bowel: No bowel obstruction or ileus. Normal appendix right
lower quadrant. No bowel wall thickening or inflammatory change.

Vascular/Lymphatic: No significant atherosclerosis. Incidental
retroaortic left renal vein, a frequent anatomic variant. No
pathologic adenopathy.

Reproductive: Status post hysterectomy. No adnexal masses.

Other: No abdominal wall hernia or abnormality. No abdominopelvic
ascites.

Musculoskeletal: No acute or destructive bony lesions. Reconstructed
images demonstrate no additional findings.
IMPRESSION: 1. No acute intra-abdominal or intrapelvic process.
2. Mild diffuse hepatic steatosis.

## 2022-04-06 IMAGING — DX DG CHEST 2V
2 series · 2 of 2 positions shown · non-contrast
Comparison: 03/23/2016

CLINICAL DATA: Chest pain, left arm pain

EXAM:
CHEST - 2 VIEW

[w chest pa]
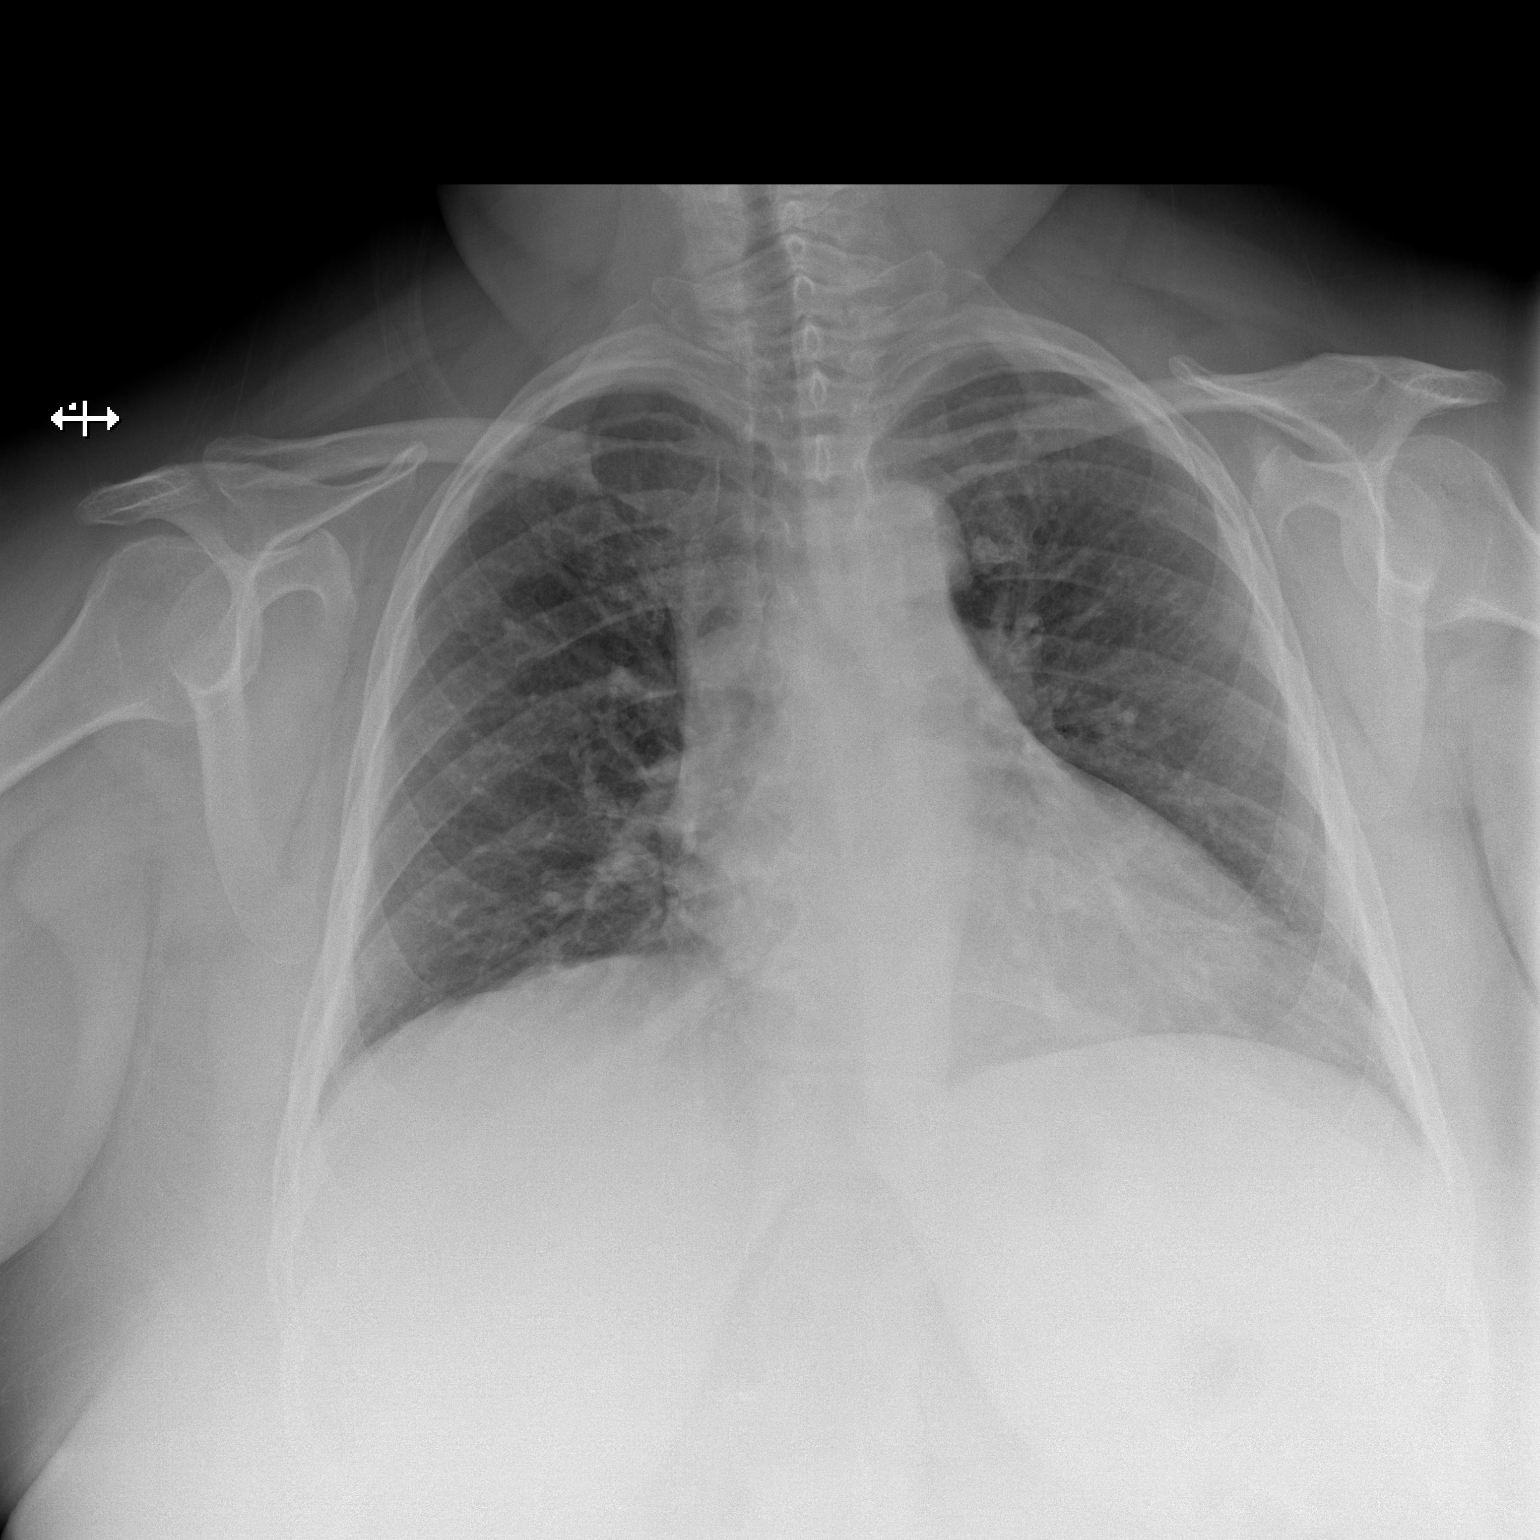

[w chest lat]
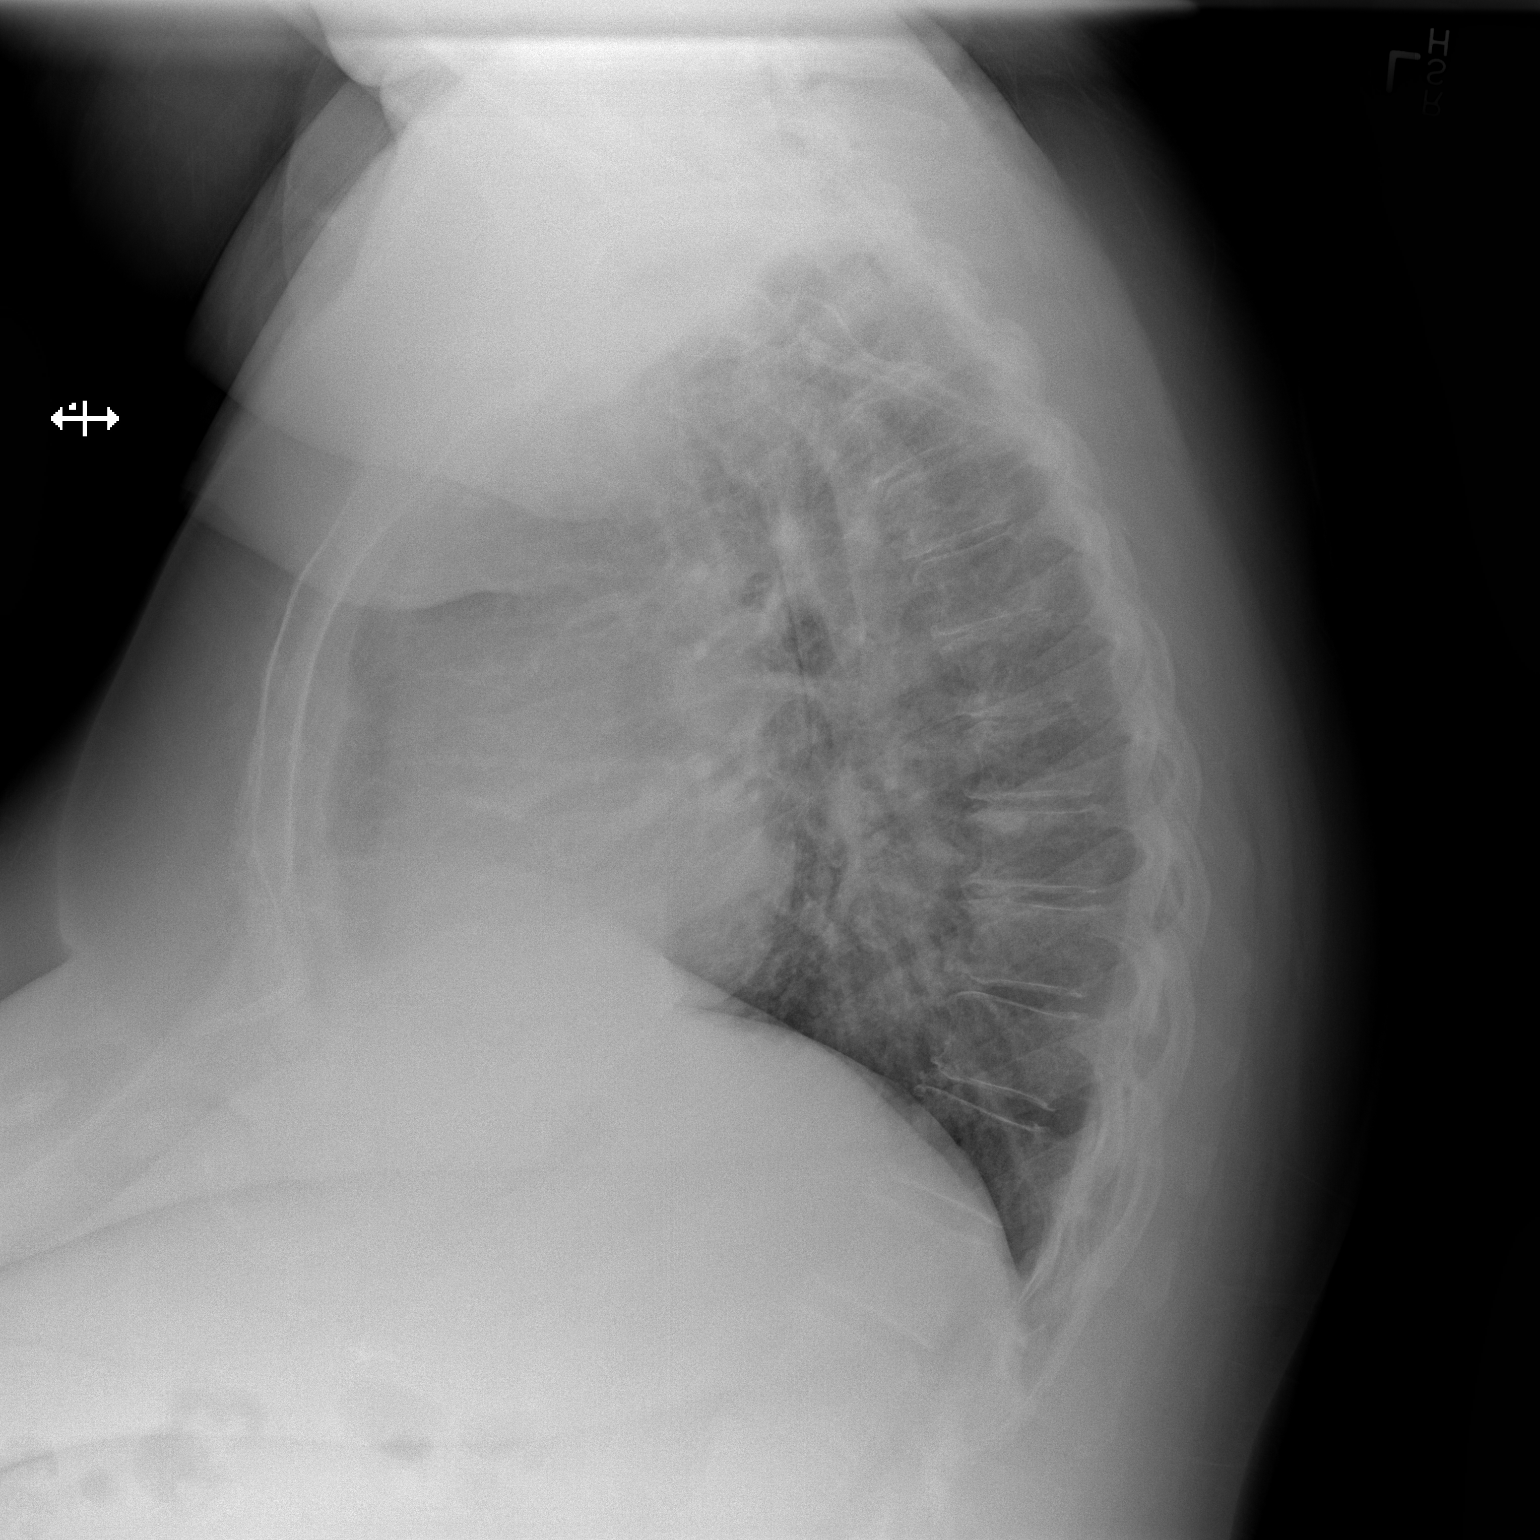

[2 of 2 positions shown; findings below may reference images not displayed]

FINDINGS: Heart and mediastinal contours are within normal limits. No focal
opacities or effusions. No acute bony abnormality.
IMPRESSION: No active cardiopulmonary disease.
